# Patient Record
Sex: Male | Born: 1980 | Race: White | Hispanic: No | Marital: Married | Smoking: Never smoker
Health system: Southern US, Community
[De-identification: ages and names within clinical notes are randomized; demographics above are authoritative.]

## PROBLEM LIST (undated history)

## (undated) DIAGNOSIS — Z978 Presence of other specified devices: Secondary | ICD-10-CM

## (undated) DIAGNOSIS — J0510 Acute epiglottitis without obstruction: Secondary | ICD-10-CM

## (undated) HISTORY — DX: Acute epiglottitis without obstruction: J05.10

## (undated) HISTORY — DX: Presence of other specified devices: Z97.8

---

## 2007-04-04 ENCOUNTER — Emergency Department (HOSPITAL_COMMUNITY): Admission: EM | Admit: 2007-04-04 | Discharge: 2007-04-04 | Payer: Self-pay | Admitting: Emergency Medicine

## 2018-02-19 ENCOUNTER — Ambulatory Visit (HOSPITAL_COMMUNITY)
Admission: EM | Admit: 2018-02-19 | Discharge: 2018-02-19 | Disposition: A | Discharge status: Home / Self Care | Payer: 59 | Attending: Family Medicine | Admitting: Family Medicine

## 2018-02-19 ENCOUNTER — Encounter (HOSPITAL_COMMUNITY): Payer: Self-pay | Admitting: Emergency Medicine

## 2018-02-19 DIAGNOSIS — M545 Low back pain, unspecified: Secondary | ICD-10-CM

## 2018-02-19 MED ORDER — PREDNISONE 10 MG PO TABS: 20.0000 mg | ORAL_TABLET | Freq: Every day | ORAL | 0 refills | Status: DC

## 2018-02-19 MED ORDER — CYCLOBENZAPRINE HCL 10 MG PO TABS: 10.0000 mg | ORAL_TABLET | Freq: Two times a day (BID) | ORAL | 0 refills | Status: DC | PRN

## 2018-02-19 NOTE — ED Triage Notes (Addendum)
PT reports lower back pain that started Tuesday morning after bending down repeatedly. PT took a muscle relaxer Tues night that helped, but pain returned the next morning. PT has tried heating pad, back pain patches, and rest.

## 2018-02-19 NOTE — ED Provider Notes (Signed)
[facility]    CSN: [id number] Arrival date & time: 02/19/18  1750     History   Chief Complaint Chief Complaint  Patient presents with  . Back Pain    HPI YAZID POP is a 37 y.o. male.   Patient complains of several day history of low back pain.  There is no radiation.  There is no injury.  Patient does some lifting and bending at work but does not think there was any one event because the pain.  There is no prior history of back problems.  HPI  History reviewed. No pertinent past medical history.  There are no active problems to display for this patient.   History reviewed. No pertinent surgical history.     Home Medications    Prior to Admission medications   Not on File    Family History No family history on file.  Social History Social History   Tobacco Use  . Smoking status: Not on file  Substance Use Topics  . Alcohol use: Not on file  . Drug use: Not on file     Allergies   Patient has no known allergies.   Review of Systems Review of Systems  Constitutional: Negative.   Musculoskeletal: Positive for back pain.     Physical Exam Triage Vital Signs ED Triage Vitals  Enc Vitals Group     BP 02/19/18 1809 (!) 145/84     Pulse Rate 02/19/18 1809 95     Resp 02/19/18 1809 16     Temp 02/19/18 1809 98.1 F (36.7 C)     Temp Source 02/19/18 1809 Oral     SpO2 02/19/18 1809 100 %     Weight 02/19/18 1810 216 lb (98 kg)     Height --      Head Circumference --      Peak Flow --      Pain Score 02/19/18 1810 6     Pain Loc --      Pain Edu? --      Excl. in GC? --    No data found.  Updated Vital Signs BP (!) 145/84   Pulse 95   Temp 98.1 F (36.7 C) (Oral)   Resp 16   Wt 98 kg   SpO2 100%   Visual Acuity Right Eye Distance:   Left Eye Distance:   Bilateral Distance:    Right Eye Near:   Left Eye Near:    Bilateral Near:     Physical Exam  Constitutional: He appears well-developed and well-nourished.    Cardiovascular: Normal rate and regular rhythm.  Pulmonary/Chest: Effort normal.  Musculoskeletal:  Back: There is normal range of motion but with increased pain with extension and forward flexion.  Deep tendon reflexes are symmetric.  Strength is normal in the lower extremities.  Normal toe and heel walking. Straight leg raising is equivocal bilaterally. Back is most tender over SI joints bilaterally.     UC Treatments / Results  Labs (all labs ordered are listed, but only abnormal results are displayed) Labs Reviewed - No data to display  EKG None  Radiology No results found.  Procedures Procedures (including critical care time)  Medications Ordered in UC Medications - No data to display  Initial Impression / Assessment and Plan / UC Course  I have reviewed the triage vital signs and the nursing notes.  Pertinent labs & imaging results that were available during my care of the patient were reviewed by  me and considered in my medical decision making (see chart for details).     Low back pain secondary to strain.  Will place him on light duty at work.  Combination of muscle relaxant and anti-inflammatory as well as back exercises. Final Clinical Impressions(s) / UC Diagnoses   Final diagnoses:  None   Discharge Instructions   None    ED Prescriptions    None     Controlled Substance Prescriptions [place] Controlled Substance Registry consulted? No   Frederica KusterMiller, Daxson Reffett M, MD 02/19/18 [id number]

## 2018-03-05 ENCOUNTER — Ambulatory Visit (HOSPITAL_COMMUNITY)
Admission: EM | Admit: 2018-03-05 | Discharge: 2018-03-05 | Disposition: A | Discharge status: Home / Self Care | Payer: 59 | Attending: Family Medicine | Admitting: Family Medicine

## 2018-03-05 DIAGNOSIS — M25511 Pain in right shoulder: Secondary | ICD-10-CM

## 2018-03-05 MED ORDER — MELOXICAM 7.5 MG PO TABS: 7.5000 mg | ORAL_TABLET | Freq: Every day | ORAL | 0 refills | Status: AC

## 2018-03-05 MED ORDER — IBUPROFEN 800 MG PO TABS: 800.0000 mg | ORAL_TABLET | Freq: Three times a day (TID) | ORAL | 0 refills | Status: DC

## 2018-03-05 NOTE — ED Provider Notes (Signed)
[facility]    CSN: [id number] Arrival date & time: 03/05/18  1751     History   Chief Complaint No chief complaint on file.   HPI Richard Nash is a 37 y.o. male no significant past medical history presenting today for evaluation of right shoulder pain.  Patient states that he woke up this morning with pain in his right shoulder, mainly located in his upper back and will send a "twinge" down arm with certain movements.  States that he does a lot of heavy lifting at work.  Denies previous issues with this shoulder.  Denies difficulty fully moving his shoulder, but pain increases with over the hand movements.  Denies numbness or tingling.  Denies difficulty moving neck or neck pain.  Denies fevers.  HPI  No past medical history on file.  There are no active problems to display for this patient.   No past surgical history on file.     Home Medications    Prior to Admission medications   Medication Sig Start Date End Date Taking? Authorizing Provider  cyclobenzaprine (FLEXERIL) 10 MG tablet Take 1 tablet (10 mg total) by mouth 2 (two) times daily as needed for muscle spasms. 02/19/18   Frederica Kuster, MD  ibuprofen (ADVIL,MOTRIN) 800 MG tablet Take 1 tablet (800 mg total) by mouth 3 (three) times daily. 03/05/18   Chanita Boden C, PA-C  meloxicam (MOBIC) 7.5 MG tablet Take 1 tablet (7.5 mg total) by mouth daily for 14 days. Take in the morning, with food. 03/05/18 03/19/18  Giah Fickett, Junius Creamer, PA-C    Family History No family history on file.  Social History Social History   Tobacco Use  . Smoking status: Not on file  Substance Use Topics  . Alcohol use: Not on file  . Drug use: Not on file     Allergies   Patient has no known allergies.   Review of Systems Review of Systems  Constitutional: Negative for fatigue and fever.  Eyes: Negative for redness, itching and visual disturbance.  Respiratory: Negative for shortness of breath.     Cardiovascular: Negative for chest pain and leg swelling.  Gastrointestinal: Negative for nausea and vomiting.  Musculoskeletal: Positive for arthralgias and myalgias. Negative for back pain, neck pain and neck stiffness.  Skin: Negative for color change, rash and wound.  Neurological: Negative for dizziness, syncope, weakness, light-headedness and headaches.     Physical Exam Triage Vital Signs ED Triage Vitals [03/05/18 1837]  Enc Vitals Group     BP 139/89     Pulse Rate 87     Resp 16     Temp 98.1 F (36.7 C)     Temp Source Oral     SpO2 100 %     Weight      Height      Head Circumference      Peak Flow      Pain Score      Pain Loc      Pain Edu?      Excl. in GC?    No data found.  Updated Vital Signs BP 139/89 (BP Location: Left Arm)   Pulse 87   Temp 98.1 F (36.7 C) (Oral)   Resp 16   SpO2 100%   Visual Acuity Right Eye Distance:   Left Eye Distance:   Bilateral Distance:    Right Eye Near:   Left Eye Near:    Bilateral Near:     Physical  Exam  Constitutional: He is oriented to person, place, and time. He appears well-developed and well-nourished.  No acute distress  HENT:  Head: Normocephalic and atraumatic.  Nose: Nose normal.  Eyes: Conjunctivae are normal.  Neck: Neck supple.  Cardiovascular: Normal rate.  Pulmonary/Chest: Effort normal. No respiratory distress.  Abdominal: He exhibits no distension.  Musculoskeletal: Normal range of motion.  Nontender to palpation throughout clavicle, tenderness to palpation over distal scapular spine near [facility] joint, full active range of motion in all directions, strength 5/5 and equal bilaterally at shoulders, negative liftoff, empty can, painful arc, resisted external rotation, Hawkins.  Neurological: He is alert and oriented to person, place, and time.  Skin: Skin is warm and dry.  Psychiatric: He has a normal mood and affect.  Nursing note and vitals reviewed.    UC Treatments / Results   Labs (all labs ordered are listed, but only abnormal results are displayed) Labs Reviewed - No data to display  EKG None  Radiology No results found.  Procedures Procedures (including critical care time)  Medications Ordered in UC Medications - No data to display  Initial Impression / Assessment and Plan / UC Course  I have reviewed the triage vital signs and the nursing notes.  Pertinent labs & imaging results that were available during my care of the patient were reviewed by me and considered in my medical decision making (see chart for details).     Patient with right shoulder pain x1 day without injury, will defer imaging, will recommend conservative treatment with anti-inflammatories, ice, activity modification.  Do not suspect rotator cuff tear at this time given full range of motion and negative special tests.  Will have patient follow-up with shoulder pain not improving as expected with above treatment over the next 1 to 2 weeks.Discussed strict return precautions. Patient verbalized understanding and is agreeable with plan.  Note- Provided Mobic to use daily, also provided ibuprofen, but advised to not use these 2 together, use one or the other depending on whichever provides him more relief.  Final Clinical Impressions(s) / UC Diagnoses   Final diagnoses:  Acute pain of right shoulder     Discharge Instructions     Use anti-inflammatories for pain/swelling. You may take up to 800 mg Ibuprofen every 8 hours OR Mobic daily with food. You may supplement Ibuprofen with Tylenol [phone] mg every 8 hours.   Alternate Ice and heat  No heavy lifting for 1 week  Follow up if shoulder pain persisting, not improving, worsening, developing stiffness, decreased range of motion    ED Prescriptions    Medication Sig Dispense Auth. Provider   meloxicam (MOBIC) 7.5 MG tablet Take 1 tablet (7.5 mg total) by mouth daily for 14 days. Take in the morning, with food. 14 tablet  Nirvana Blanchett C, PA-C   ibuprofen (ADVIL,MOTRIN) 800 MG tablet Take 1 tablet (800 mg total) by mouth 3 (three) times daily. 21 tablet Krystle Oberman, LiverpoolHallie C, PA-C     Controlled Substance Prescriptions [place] Controlled Substance Registry consulted? Not Applicable   Foy GuadalajaraWieters, Marianne Golightly C, PA-C 03/05/18 1905

## 2018-03-05 NOTE — Discharge Instructions (Addendum)
Use anti-inflammatories for pain/swelling. You may take up to 800 mg Ibuprofen every 8 hours OR Mobic daily with food. You may supplement Ibuprofen with Tylenol [phone] mg every 8 hours.   Alternate Ice and heat  No heavy lifting for 1 week  Follow up if shoulder pain persisting, not improving, worsening, developing stiffness, decreased range of motion

## 2018-12-09 ENCOUNTER — Other Ambulatory Visit: Payer: 59

## 2018-12-09 ENCOUNTER — Other Ambulatory Visit: Payer: Self-pay

## 2018-12-09 DIAGNOSIS — Z20822 Contact with and (suspected) exposure to covid-19: Secondary | ICD-10-CM

## 2018-12-10 LAB — NOVEL CORONAVIRUS, NAA: SARS-CoV-2, NAA: NOT DETECTED

## 2019-08-11 DIAGNOSIS — Z0189 Encounter for other specified special examinations: Secondary | ICD-10-CM | POA: Diagnosis not present

## 2019-08-11 DIAGNOSIS — F1210 Cannabis abuse, uncomplicated: Secondary | ICD-10-CM | POA: Diagnosis not present

## 2019-08-11 DIAGNOSIS — M1A0720 Idiopathic chronic gout, left ankle and foot, without tophus (tophi): Secondary | ICD-10-CM | POA: Diagnosis not present

## 2019-08-27 ENCOUNTER — Ambulatory Visit: Payer: 59 | Attending: Internal Medicine

## 2019-08-30 DIAGNOSIS — Z23 Encounter for immunization: Secondary | ICD-10-CM | POA: Diagnosis not present

## 2019-09-28 DIAGNOSIS — Z23 Encounter for immunization: Secondary | ICD-10-CM | POA: Diagnosis not present

## 2020-09-22 ENCOUNTER — Emergency Department (HOSPITAL_COMMUNITY): Payer: No Typology Code available for payment source | Admitting: Anesthesiology

## 2020-09-22 ENCOUNTER — Encounter (HOSPITAL_COMMUNITY)
Admission: EM | Disposition: A | Discharge status: Home / Self Care | Payer: Self-pay | Source: Home / Self Care | Attending: Emergency Medicine

## 2020-09-22 ENCOUNTER — Ambulatory Visit (HOSPITAL_COMMUNITY)
Admission: EM | Admit: 2020-09-22 | Discharge: 2020-09-23 | Disposition: A | Discharge status: Home / Self Care | Payer: No Typology Code available for payment source | Attending: Orthopedic Surgery | Admitting: Orthopedic Surgery

## 2020-09-22 ENCOUNTER — Emergency Department (HOSPITAL_COMMUNITY): Payer: No Typology Code available for payment source

## 2020-09-22 ENCOUNTER — Other Ambulatory Visit: Payer: Self-pay

## 2020-09-22 ENCOUNTER — Encounter (HOSPITAL_COMMUNITY): Payer: Self-pay

## 2020-09-22 DIAGNOSIS — F1290 Cannabis use, unspecified, uncomplicated: Secondary | ICD-10-CM | POA: Diagnosis not present

## 2020-09-22 DIAGNOSIS — M7989 Other specified soft tissue disorders: Secondary | ICD-10-CM | POA: Diagnosis not present

## 2020-09-22 DIAGNOSIS — R0902 Hypoxemia: Secondary | ICD-10-CM | POA: Diagnosis not present

## 2020-09-22 DIAGNOSIS — Z23 Encounter for immunization: Secondary | ICD-10-CM | POA: Diagnosis not present

## 2020-09-22 DIAGNOSIS — S6990XA Unspecified injury of unspecified wrist, hand and finger(s), initial encounter: Secondary | ICD-10-CM | POA: Diagnosis not present

## 2020-09-22 DIAGNOSIS — S61402A Unspecified open wound of left hand, initial encounter: Secondary | ICD-10-CM | POA: Diagnosis not present

## 2020-09-22 DIAGNOSIS — R52 Pain, unspecified: Secondary | ICD-10-CM | POA: Diagnosis not present

## 2020-09-22 DIAGNOSIS — S472XXA Crushing injury of left shoulder and upper arm, initial encounter: Secondary | ICD-10-CM | POA: Diagnosis not present

## 2020-09-22 DIAGNOSIS — W230XXA Caught, crushed, jammed, or pinched between moving objects, initial encounter: Secondary | ICD-10-CM | POA: Insufficient documentation

## 2020-09-22 DIAGNOSIS — Z20822 Contact with and (suspected) exposure to covid-19: Secondary | ICD-10-CM | POA: Insufficient documentation

## 2020-09-22 DIAGNOSIS — S5782XA Crushing injury of left forearm, initial encounter: Secondary | ICD-10-CM | POA: Diagnosis not present

## 2020-09-22 DIAGNOSIS — S59902A Unspecified injury of left elbow, initial encounter: Secondary | ICD-10-CM | POA: Diagnosis not present

## 2020-09-22 DIAGNOSIS — Y990 Civilian activity done for income or pay: Secondary | ICD-10-CM | POA: Diagnosis not present

## 2020-09-22 DIAGNOSIS — S6992XA Unspecified injury of left wrist, hand and finger(s), initial encounter: Secondary | ICD-10-CM | POA: Diagnosis not present

## 2020-09-22 DIAGNOSIS — S6722XA Crushing injury of left hand, initial encounter: Secondary | ICD-10-CM | POA: Insufficient documentation

## 2020-09-22 DIAGNOSIS — S5782XD Crushing injury of left forearm, subsequent encounter: Secondary | ICD-10-CM

## 2020-09-22 DIAGNOSIS — S59912A Unspecified injury of left forearm, initial encounter: Secondary | ICD-10-CM | POA: Diagnosis not present

## 2020-09-22 HISTORY — PX: I & D EXTREMITY: SHX5045

## 2020-09-22 LAB — CBC WITH DIFFERENTIAL/PLATELET
Abs Immature Granulocytes: 0.02 10*3/uL (ref 0.00–0.07)
Basophils Absolute: 0.1 10*3/uL (ref 0.0–0.1)
Basophils Relative: 1 %
Eosinophils Absolute: 0.3 10*3/uL (ref 0.0–0.5)
Eosinophils Relative: 3 %
HCT: 45.8 % (ref 39.0–52.0)
Hemoglobin: 15.1 g/dL (ref 13.0–17.0)
Immature Granulocytes: 0 %
Lymphocytes Relative: 32 %
Lymphs Abs: 2.9 10*3/uL (ref 0.7–4.0)
MCH: 29.7 pg (ref 26.0–34.0)
MCHC: 33 g/dL (ref 30.0–36.0)
MCV: 90 fL (ref 80.0–100.0)
Monocytes Absolute: 0.9 10*3/uL (ref 0.1–1.0)
Monocytes Relative: 10 %
Neutro Abs: 5 10*3/uL (ref 1.7–7.7)
Neutrophils Relative %: 54 %
Platelets: 333 10*3/uL (ref 150–400)
RBC: 5.09 MIL/uL (ref 4.22–5.81)
RDW: 12.6 % (ref 11.5–15.5)
WBC: 9.1 10*3/uL (ref 4.0–10.5)
nRBC: 0 % (ref 0.0–0.2)

## 2020-09-22 LAB — BASIC METABOLIC PANEL
Anion gap: 20 — ABNORMAL HIGH (ref 5–15)
BUN: 14 mg/dL (ref 6–20)
CO2: 13 mmol/L — ABNORMAL LOW (ref 22–32)
Calcium: 9 mg/dL (ref 8.9–10.3)
Chloride: 104 mmol/L (ref 98–111)
Creatinine, Ser: 1.35 mg/dL — ABNORMAL HIGH (ref 0.61–1.24)
GFR, Estimated: >60 mL/min (ref >60)
Glucose, Bld: 167 mg/dL — ABNORMAL HIGH (ref 70–99)
Potassium: 3.7 mmol/L (ref 3.5–5.1)
Sodium: 137 mmol/L (ref 135–145)

## 2020-09-22 LAB — RESP PANEL BY RT-PCR (FLU A&B, COVID) ARPGX2
Influenza A by PCR: NEGATIVE
Influenza B by PCR: NEGATIVE
SARS Coronavirus 2 by RT PCR: NEGATIVE

## 2020-09-22 LAB — RAPID URINE DRUG SCREEN, HOSP PERFORMED
Amphetamines: NOT DETECTED
Barbiturates: NOT DETECTED
Benzodiazepines: POSITIVE — AB
Cocaine: NOT DETECTED
Opiates: POSITIVE — AB
Tetrahydrocannabinol: POSITIVE — AB

## 2020-09-22 LAB — CK: Total CK: 252 U/L (ref 49–397)

## 2020-09-22 LAB — SURGICAL PCR SCREEN
MRSA, PCR: NEGATIVE
Staphylococcus aureus: NEGATIVE

## 2020-09-22 SURGERY — IRRIGATION AND DEBRIDEMENT EXTREMITY
Anesthesia: General | Site: Hand | Laterality: Left

## 2020-09-22 MED ORDER — HYDROMORPHONE HCL 1 MG/ML IJ SOLN
1.0000 mg | Freq: Once | INTRAMUSCULAR | Status: DC
  Filled 2020-09-22: qty 1

## 2020-09-22 MED ORDER — DIPHENHYDRAMINE HCL 25 MG PO CAPS: 25.0000 mg | ORAL_CAPSULE | Freq: Four times a day (QID) | ORAL | Status: DC | PRN

## 2020-09-22 MED ORDER — CHLORHEXIDINE GLUCONATE 0.12 % MT SOLN
OROMUCOSAL | Status: AC
  Administered 2020-09-22: 15 mL via OROMUCOSAL
  Filled 2020-09-22: qty 15

## 2020-09-22 MED ORDER — CEFAZOLIN SODIUM-DEXTROSE 1-4 GM/50ML-% IV SOLN
1.0000 g | Freq: Three times a day (TID) | INTRAVENOUS | Status: DC
  Administered 2020-09-23: 1 g via INTRAVENOUS
  Filled 2020-09-22 (×3): qty 50

## 2020-09-22 MED ORDER — TETANUS-DIPHTH-ACELL PERTUSSIS 5-2.5-18.5 LF-MCG/0.5 IM SUSY
0.5000 mL | PREFILLED_SYRINGE | Freq: Once | INTRAMUSCULAR | Status: AC
  Administered 2020-09-22: 0.5 mL via INTRAMUSCULAR
  Filled 2020-09-22: qty 0.5

## 2020-09-22 MED ORDER — FENTANYL CITRATE (PF) 100 MCG/2ML IJ SOLN
INTRAMUSCULAR | Status: DC | PRN
  Administered 2020-09-22 (×5): 50 ug via INTRAVENOUS

## 2020-09-22 MED ORDER — DEXAMETHASONE SODIUM PHOSPHATE 10 MG/ML IJ SOLN
INTRAMUSCULAR | Status: AC
  Filled 2020-09-22: qty 1

## 2020-09-22 MED ORDER — FENTANYL CITRATE (PF) 250 MCG/5ML IJ SOLN
INTRAMUSCULAR | Status: AC
  Filled 2020-09-22: qty 5

## 2020-09-22 MED ORDER — FENTANYL CITRATE (PF) 100 MCG/2ML IJ SOLN: 25.0000 ug | INTRAMUSCULAR | Status: DC | PRN

## 2020-09-22 MED ORDER — OXYCODONE HCL 5 MG PO TABS: 5.0000 mg | ORAL_TABLET | ORAL | Status: DC | PRN

## 2020-09-22 MED ORDER — DEXAMETHASONE SODIUM PHOSPHATE 10 MG/ML IJ SOLN
INTRAMUSCULAR | Status: DC | PRN
  Administered 2020-09-22: 8 mg via INTRAVENOUS

## 2020-09-22 MED ORDER — SODIUM CHLORIDE 0.9 % IV BOLUS
1000.0000 mL | Freq: Once | INTRAVENOUS | Status: AC
  Administered 2020-09-22: 1000 mL via INTRAVENOUS

## 2020-09-22 MED ORDER — METHOCARBAMOL 500 MG PO TABS: 500.0000 mg | ORAL_TABLET | Freq: Four times a day (QID) | ORAL | Status: DC | PRN

## 2020-09-22 MED ORDER — MIDAZOLAM HCL 5 MG/5ML IJ SOLN
INTRAMUSCULAR | Status: DC | PRN
  Administered 2020-09-22: 2 mg via INTRAVENOUS

## 2020-09-22 MED ORDER — SENNOSIDES-DOCUSATE SODIUM 8.6-50 MG PO TABS: 1.0000 | ORAL_TABLET | Freq: Every evening | ORAL | Status: DC | PRN

## 2020-09-22 MED ORDER — CEFAZOLIN SODIUM-DEXTROSE 1-4 GM/50ML-% IV SOLN
1.0000 g | Freq: Once | INTRAVENOUS | Status: AC
  Administered 2020-09-22: 1 g via INTRAVENOUS
  Filled 2020-09-22 (×2): qty 50

## 2020-09-22 MED ORDER — ONDANSETRON HCL 4 MG PO TABS: 4.0000 mg | ORAL_TABLET | Freq: Four times a day (QID) | ORAL | Status: DC | PRN

## 2020-09-22 MED ORDER — HYDROMORPHONE HCL 1 MG/ML IJ SOLN
1.0000 mg | Freq: Once | INTRAMUSCULAR | Status: AC
  Administered 2020-09-22: 1 mg via INTRAVENOUS
  Filled 2020-09-22: qty 1

## 2020-09-22 MED ORDER — OXYCODONE HCL 5 MG PO TABS
10.0000 mg | ORAL_TABLET | ORAL | Status: DC | PRN
Start: 2020-09-22 — End: 2020-09-23
  Administered 2020-09-22 – 2020-09-23 (×4): 15 mg via ORAL
  Filled 2020-09-22 (×4): qty 3

## 2020-09-22 MED ORDER — PROPOFOL 10 MG/ML IV BOLUS
INTRAVENOUS | Status: DC | PRN
  Administered 2020-09-22: 150 mg via INTRAVENOUS

## 2020-09-22 MED ORDER — 0.9 % SODIUM CHLORIDE (POUR BTL) OPTIME
TOPICAL | Status: DC | PRN
  Administered 2020-09-22 (×2): 1000 mL

## 2020-09-22 MED ORDER — ACETAMINOPHEN 500 MG PO TABS
1000.0000 mg | ORAL_TABLET | Freq: Four times a day (QID) | ORAL | Status: DC
  Administered 2020-09-22 – 2020-09-23 (×2): 1000 mg via ORAL
  Filled 2020-09-22 (×2): qty 2

## 2020-09-22 MED ORDER — DEXMEDETOMIDINE (PRECEDEX) IN NS 20 MCG/5ML (4 MCG/ML) IV SYRINGE
PREFILLED_SYRINGE | INTRAVENOUS | Status: AC
  Filled 2020-09-22: qty 5

## 2020-09-22 MED ORDER — PROPOFOL 10 MG/ML IV BOLUS
INTRAVENOUS | Status: AC
  Filled 2020-09-22: qty 20

## 2020-09-22 MED ORDER — OXYCODONE HCL 5 MG PO TABS: 10.0000 mg | ORAL_TABLET | ORAL | Status: DC | PRN

## 2020-09-22 MED ORDER — CHLORHEXIDINE GLUCONATE 0.12 % MT SOLN: 15.0000 mL | Freq: Once | OROMUCOSAL | Status: AC

## 2020-09-22 MED ORDER — ONDANSETRON HCL 4 MG/2ML IJ SOLN
INTRAMUSCULAR | Status: DC | PRN
  Administered 2020-09-22: 4 mg via INTRAVENOUS

## 2020-09-22 MED ORDER — HYDROMORPHONE HCL 1 MG/ML IJ SOLN: 0.5000 mg | INTRAMUSCULAR | Status: DC | PRN

## 2020-09-22 MED ORDER — CEFAZOLIN SODIUM-DEXTROSE 1-4 GM/50ML-% IV SOLN: 1.0000 g | INTRAVENOUS | Status: DC

## 2020-09-22 MED ORDER — ACETAMINOPHEN 500 MG PO TABS: 1000.0000 mg | ORAL_TABLET | Freq: Four times a day (QID) | ORAL | Status: DC

## 2020-09-22 MED ORDER — CEFAZOLIN SODIUM-DEXTROSE 1-4 GM/50ML-% IV SOLN
1.0000 g | INTRAVENOUS | Status: AC
  Administered 2020-09-22: 1 g via INTRAVENOUS
  Filled 2020-09-22: qty 50

## 2020-09-22 MED ORDER — KCL IN DEXTROSE-NACL 20-5-0.45 MEQ/L-%-% IV SOLN
INTRAVENOUS | Status: DC
  Filled 2020-09-22 (×3): qty 1000

## 2020-09-22 MED ORDER — SUGAMMADEX SODIUM 200 MG/2ML IV SOLN
INTRAVENOUS | Status: DC | PRN
  Administered 2020-09-22: 200 mg via INTRAVENOUS

## 2020-09-22 MED ORDER — ACETAMINOPHEN 325 MG PO TABS
325.0000 mg | ORAL_TABLET | Freq: Four times a day (QID) | ORAL | Status: DC | PRN
Start: 2020-09-23 — End: 2020-09-23

## 2020-09-22 MED ORDER — METHOCARBAMOL 1000 MG/10ML IJ SOLN: 500.0000 mg | Freq: Four times a day (QID) | INTRAVENOUS | Status: DC | PRN

## 2020-09-22 MED ORDER — CEFAZOLIN SODIUM-DEXTROSE 1-4 GM/50ML-% IV SOLN: 1.0000 g | Freq: Three times a day (TID) | INTRAVENOUS | Status: DC

## 2020-09-22 MED ORDER — HYDROMORPHONE HCL 1 MG/ML IJ SOLN
INTRAMUSCULAR | Status: AC
  Administered 2020-09-22: 1 mg via INTRAVENOUS
  Filled 2020-09-22: qty 1

## 2020-09-22 MED ORDER — CEFAZOLIN SODIUM-DEXTROSE 2-4 GM/100ML-% IV SOLN
INTRAVENOUS | Status: AC
  Filled 2020-09-22: qty 100

## 2020-09-22 MED ORDER — ONDANSETRON HCL 4 MG/2ML IJ SOLN: 4.0000 mg | Freq: Four times a day (QID) | INTRAMUSCULAR | Status: DC | PRN

## 2020-09-22 MED ORDER — MIDAZOLAM HCL 2 MG/2ML IJ SOLN
INTRAMUSCULAR | Status: AC
  Filled 2020-09-22: qty 2

## 2020-09-22 MED ORDER — MORPHINE SULFATE (PF) 4 MG/ML IV SOLN
4.0000 mg | Freq: Once | INTRAVENOUS | Status: AC
  Administered 2020-09-22: 4 mg via INTRAVENOUS
  Filled 2020-09-22: qty 1

## 2020-09-22 MED ORDER — SODIUM CHLORIDE 0.9 % IR SOLN
Status: DC | PRN
  Administered 2020-09-22: 3000 mL

## 2020-09-22 MED ORDER — ASCORBIC ACID 500 MG PO TABS
1000.0000 mg | ORAL_TABLET | Freq: Every day | ORAL | Status: DC
  Administered 2020-09-22 – 2020-09-23 (×2): 1000 mg via ORAL
  Filled 2020-09-22 (×2): qty 2

## 2020-09-22 MED ORDER — PHENYLEPHRINE 40 MCG/ML (10ML) SYRINGE FOR IV PUSH (FOR BLOOD PRESSURE SUPPORT)
PREFILLED_SYRINGE | INTRAVENOUS | Status: AC
  Filled 2020-09-22: qty 10

## 2020-09-22 MED ORDER — ONDANSETRON HCL 4 MG/2ML IJ SOLN
INTRAMUSCULAR | Status: AC
  Filled 2020-09-22: qty 2

## 2020-09-22 MED ORDER — PROMETHAZINE HCL 25 MG/ML IJ SOLN: 6.2500 mg | INTRAMUSCULAR | Status: DC | PRN

## 2020-09-22 MED ORDER — ADULT MULTIVITAMIN W/MINERALS CH
1.0000 | ORAL_TABLET | Freq: Every day | ORAL | Status: DC
  Administered 2020-09-22 – 2020-09-23 (×2): 1 via ORAL
  Filled 2020-09-22 (×2): qty 1

## 2020-09-22 MED ORDER — ASCORBIC ACID 500 MG PO TABS: 1000.0000 mg | ORAL_TABLET | Freq: Every day | ORAL | Status: DC

## 2020-09-22 MED ORDER — CEFAZOLIN SODIUM-DEXTROSE 2-4 GM/100ML-% IV SOLN
2.0000 g | INTRAVENOUS | Status: AC
  Administered 2020-09-22: 2 g via INTRAVENOUS

## 2020-09-22 MED ORDER — LACTATED RINGERS IV SOLN: INTRAVENOUS | Status: DC

## 2020-09-22 MED ORDER — CHLORHEXIDINE GLUCONATE 4 % EX LIQD: 60.0000 mL | Freq: Once | CUTANEOUS | Status: DC

## 2020-09-22 MED ORDER — POVIDONE-IODINE 10 % EX SWAB: 2.0000 "application " | Freq: Once | CUTANEOUS | Status: DC

## 2020-09-22 MED ORDER — LIDOCAINE 2% (20 MG/ML) 5 ML SYRINGE
INTRAMUSCULAR | Status: DC | PRN
  Administered 2020-09-22: 100 mg via INTRAVENOUS

## 2020-09-22 MED ORDER — METHOCARBAMOL 1000 MG/10ML IJ SOLN
500.0000 mg | Freq: Four times a day (QID) | INTRAVENOUS | Status: DC | PRN
  Filled 2020-09-22: qty 5

## 2020-09-22 MED ORDER — HYDROMORPHONE HCL 1 MG/ML IJ SOLN: 1.0000 mg | INTRAMUSCULAR | Status: DC | PRN

## 2020-09-22 MED ORDER — DEXMEDETOMIDINE (PRECEDEX) IN NS 20 MCG/5ML (4 MCG/ML) IV SYRINGE
PREFILLED_SYRINGE | INTRAVENOUS | Status: DC | PRN
  Administered 2020-09-22: 12 ug via INTRAVENOUS
  Administered 2020-09-22: 8 ug via INTRAVENOUS

## 2020-09-22 MED ORDER — ACETAMINOPHEN 325 MG PO TABS: 325.0000 mg | ORAL_TABLET | Freq: Four times a day (QID) | ORAL | Status: DC | PRN

## 2020-09-22 MED ORDER — ORAL CARE MOUTH RINSE: 15.0000 mL | Freq: Once | OROMUCOSAL | Status: AC

## 2020-09-22 MED ORDER — HYDROMORPHONE HCL 1 MG/ML IJ SOLN
1.0000 mg | Freq: Once | INTRAMUSCULAR | Status: AC
  Administered 2020-09-22: 1 mg via INTRAVENOUS

## 2020-09-22 MED ORDER — HYDROMORPHONE HCL 1 MG/ML IJ SOLN
0.5000 mg | INTRAMUSCULAR | Status: DC | PRN
  Administered 2020-09-22: 1 mg via INTRAVENOUS
  Filled 2020-09-22: qty 1

## 2020-09-22 MED ORDER — ROCURONIUM BROMIDE 10 MG/ML (PF) SYRINGE
PREFILLED_SYRINGE | INTRAVENOUS | Status: DC | PRN
  Administered 2020-09-22: 20 mg via INTRAVENOUS
  Administered 2020-09-22: 60 mg via INTRAVENOUS

## 2020-09-22 SURGICAL SUPPLY — 59 items
BNDG COHESIVE 1X5 TAN STRL LF (GAUZE/BANDAGES/DRESSINGS) IMPLANT
BNDG CONFORM 2 STRL LF (GAUZE/BANDAGES/DRESSINGS) IMPLANT
BNDG ELASTIC 3X5.8 VLCR STR LF (GAUZE/BANDAGES/DRESSINGS) ×3 IMPLANT
BNDG ELASTIC 4X5.8 VLCR STR LF (GAUZE/BANDAGES/DRESSINGS) ×3 IMPLANT
BNDG ESMARK 4X9 LF (GAUZE/BANDAGES/DRESSINGS) ×3 IMPLANT
BNDG GAUZE ELAST 4 BULKY (GAUZE/BANDAGES/DRESSINGS) ×12 IMPLANT
CORD BIPOLAR FORCEPS 12FT (ELECTRODE) ×3 IMPLANT
COVER SURGICAL LIGHT HANDLE (MISCELLANEOUS) ×3 IMPLANT
COVER WAND RF STERILE (DRAPES) IMPLANT
CUFF TOURN SGL QUICK 18X4 (TOURNIQUET CUFF) ×3 IMPLANT
CUFF TOURN SGL QUICK 24 (TOURNIQUET CUFF)
CUFF TRNQT CYL 24X4X16.5-23 (TOURNIQUET CUFF) IMPLANT
DRAIN PENROSE 1/4X12 LTX STRL (WOUND CARE) IMPLANT
DRAPE SURG 17X23 STRL (DRAPES) ×3 IMPLANT
DRSG ADAPTIC 3X8 NADH LF (GAUZE/BANDAGES/DRESSINGS) ×3 IMPLANT
ELECT REM PT RETURN 9FT ADLT (ELECTROSURGICAL)
ELECTRODE REM PT RTRN 9FT ADLT (ELECTROSURGICAL) IMPLANT
GAUZE SPONGE 4X4 12PLY STRL (GAUZE/BANDAGES/DRESSINGS) ×3 IMPLANT
GAUZE SPONGE 4X4 12PLY STRL LF (GAUZE/BANDAGES/DRESSINGS) ×3 IMPLANT
GAUZE XEROFORM 1X8 LF (GAUZE/BANDAGES/DRESSINGS) ×3 IMPLANT
GAUZE XEROFORM 5X9 LF (GAUZE/BANDAGES/DRESSINGS) ×6 IMPLANT
GLOVE BIOGEL PI IND STRL 8.5 (GLOVE) ×1 IMPLANT
GLOVE BIOGEL PI INDICATOR 8.5 (GLOVE) ×2
GLOVE SURG ORTHO 8.0 STRL STRW (GLOVE) ×3 IMPLANT
GOWN STRL REUS W/ TWL LRG LVL3 (GOWN DISPOSABLE) ×3 IMPLANT
GOWN STRL REUS W/ TWL XL LVL3 (GOWN DISPOSABLE) ×1 IMPLANT
GOWN STRL REUS W/TWL LRG LVL3 (GOWN DISPOSABLE) ×6
GOWN STRL REUS W/TWL XL LVL3 (GOWN DISPOSABLE) ×2
HANDPIECE INTERPULSE COAX TIP (DISPOSABLE)
KIT BASIN OR (CUSTOM PROCEDURE TRAY) ×3 IMPLANT
KIT TURNOVER KIT B (KITS) ×3 IMPLANT
MANIFOLD NEPTUNE II (INSTRUMENTS) ×3 IMPLANT
NEEDLE HYPO 25GX1X1/2 BEV (NEEDLE) IMPLANT
NS IRRIG 1000ML POUR BTL (IV SOLUTION) ×3 IMPLANT
PACK ORTHO EXTREMITY (CUSTOM PROCEDURE TRAY) ×3 IMPLANT
PAD ARMBOARD 7.5X6 YLW CONV (MISCELLANEOUS) ×6 IMPLANT
PAD CAST 4YDX4 CTTN HI CHSV (CAST SUPPLIES) IMPLANT
PADDING CAST COTTON 4X4 STRL (CAST SUPPLIES)
SET CYSTO W/LG BORE CLAMP LF (SET/KITS/TRAYS/PACK) IMPLANT
SET HNDPC FAN SPRY TIP SCT (DISPOSABLE) IMPLANT
SET MONITOR QUICK PRESSURE (MISCELLANEOUS) ×3 IMPLANT
SOAP 2 % CHG 4 OZ (WOUND CARE) IMPLANT
SPLINT FIBERGLASS 4X30 (CAST SUPPLIES) ×3 IMPLANT
SPONGE LAP 18X18 RF (DISPOSABLE) ×3 IMPLANT
SPONGE LAP 4X18 RFD (DISPOSABLE) ×3 IMPLANT
SUT ETHILON 4 0 PS 2 18 (SUTURE) IMPLANT
SUT ETHILON 5 0 P 3 18 (SUTURE)
SUT NYLON ETHILON 5-0 P-3 1X18 (SUTURE) IMPLANT
SUT PROLENE 3 0 PS 1 (SUTURE) ×27 IMPLANT
SWAB COLLECTION DEVICE MRSA (MISCELLANEOUS) IMPLANT
SWAB CULTURE ESWAB REG 1ML (MISCELLANEOUS) IMPLANT
SYR CONTROL 10ML LL (SYRINGE) IMPLANT
TOWEL GREEN STERILE (TOWEL DISPOSABLE) ×3 IMPLANT
TOWEL GREEN STERILE FF (TOWEL DISPOSABLE) ×3 IMPLANT
TUBE CONNECTING 12'X1/4 (SUCTIONS) ×1
TUBE CONNECTING 12X1/4 (SUCTIONS) ×2 IMPLANT
UNDERPAD 30X36 HEAVY ABSORB (UNDERPADS AND DIAPERS) ×3 IMPLANT
WATER STERILE IRR 1000ML POUR (IV SOLUTION) ×3 IMPLANT
YANKAUER SUCT BULB TIP NO VENT (SUCTIONS) ×3 IMPLANT

## 2020-09-22 NOTE — ED Provider Notes (Signed)
Care assumed from Dr. Particia Nearing after patient was transferred from [facility] emergency department for left arm injury.  Patient's arm was pulled up into a wall at work.  No underlying fractures but partial degloving of the left hand and bruising extending up to the axilla.  Compartments are swollen but still soft.  Hand surgery was consulted and requested transfer to [facility] for evaluation and surgical repair.  Physical Exam  BP 133/78   Pulse 69   Temp 97.8 F (36.6 C) (Oral)   Resp 16   Ht 5\' 9"  (1.753 m)   Wt 90.7 kg   SpO2 98%   BMI 29.53 kg/m   Physical Exam Vitals and nursing note reviewed.  Constitutional:      General: He is not in acute distress.    Appearance: Normal appearance. He is well-developed. He is not diaphoretic.  HENT:     Head: Normocephalic and atraumatic.  Eyes:     General:        Right eye: No discharge.        Left eye: No discharge.  Pulmonary:     Effort: Pulmonary effort is normal. No respiratory distress.  Musculoskeletal:     Comments: Left upper extremity in splint for transport, neurovascularly intact with normal sensation and of the fingers.  Bruising extending up to the axilla, but compartments soft  Skin:    General: Skin is warm and dry.  Neurological:     Mental Status: He is alert and oriented to person, place, and time.     Coordination: Coordination normal.  Psychiatric:        Behavior: Behavior normal.     ED Course/Procedures   Labs Reviewed  BASIC METABOLIC PANEL - Abnormal; Notable for the following components:      Result Value   CO2 13 (*)    Glucose, Bld 167 (*)    Creatinine, Ser 1.35 (*)    Anion gap 20 (*)    All other components within normal limits  RESP PANEL BY RT-PCR (FLU A&B, COVID) ARPGX2  CBC WITH DIFFERENTIAL/PLATELET  CK   DG Elbow Complete Left  Result Date: 09/22/2020 CLINICAL DATA:  Work related injury to the left arm. EXAM: LEFT ELBOW - COMPLETE 3+ VIEW COMPARISON:  None. FINDINGS: Soft tissue  swelling and gas medial to the elbow and posterior to the proximal forearm. No fracture, subluxation, or elbow joint effusion. No opaque foreign body IMPRESSION: Soft tissue injury without opaque foreign body or fracture. Electronically Signed   By: 11/22/2020 M.D.   On: 09/22/2020 07:39   DG Forearm Left  Result Date: 09/22/2020 CLINICAL DATA:  Work related injury. EXAM: LEFT FOREARM - 2 VIEW COMPARISON:  None. FINDINGS: Soft tissue gas at the hand/wrist and about the elbow. No opaque foreign body, fracture, or subluxation. IMPRESSION: Soft tissue injury without opaque foreign body or fracture. Electronically Signed   By: 11/22/2020 M.D.   On: 09/22/2020 07:38   DG Hand Complete Left  Result Date: 09/22/2020 CLINICAL DATA:  Work accident with left hand injury. EXAM: LEFT HAND - COMPLETE 3+ VIEW COMPARISON:  None. FINDINGS: Scattered soft tissue gas consistent with soft tissue injury. No opaque foreign body, fracture, or dislocation. IMPRESSION: Soft tissue injury without fracture or opaque foreign body. Electronically Signed   By: 11/22/2020 M.D.   On: 09/22/2020 07:29     Procedures  MDM   PA 11/22/2020 with hand surgery is at bedside to evaluate patient  upon arrival.  They will plan to take patient to the OR.  He will be given Ancef and I have ordered medication for pain control in the meantime before he goes up to the operating room.     Dartha Lodge, PA-C 09/22/20 1033    Blane Ohara, MD 09/23/20 [phone]

## 2020-09-22 NOTE — Anesthesia Postprocedure Evaluation (Signed)
Anesthesia Post Note  Patient: Richard Nash  Procedure(s) Performed: IRRIGATION AND DEBRIDEMENT FOREARM AND HAND WITH REPAIR (Left Hand)     Patient location during evaluation: PACU Anesthesia Type: General Level of consciousness: awake Pain management: pain level controlled Vital Signs Assessment: post-procedure vital signs reviewed and stable Respiratory status: spontaneous breathing, nonlabored ventilation, respiratory function stable and patient connected to nasal cannula oxygen Cardiovascular status: blood pressure returned to baseline and stable Postop Assessment: no apparent nausea or vomiting Anesthetic complications: no   No complications documented.  Last Vitals:  Vitals:   09/22/20 1754 09/22/20 2023  BP: (!) 131/93 (!) 139/97  Pulse: 77 92  Resp: 17 17  Temp: (!) 36.3 C 36.7 C  SpO2: 99% 99%    Last Pain:  Vitals:   09/22/20 2023  TempSrc: Oral  PainSc:                  Catheryn Bacon Mazal Ebey

## 2020-09-22 NOTE — Op Note (Signed)
PREOPERATIVE DIAGNOSIS: Left forearm crush injury with significant soft tissue injury to the palmar aspect of the left hand.  POSTOPERATIVE DIAGNOSIS: Same  ATTENDING SURGEON: Dr. Bradly Bienenstock who scrubbed and present for the entire procedure  ASSISTANT SURGEON: None  ANESTHESIA: General via endotracheal tube  OPERATIVE PROCEDURE: #1: Left forearm compartment pressure monitoring, measurements #2: Left hand irrigation and debridement significant soft tissue injury 15 cm x 3 cm wound volar aspect of the hand #3: Left hand irrigation and debridement significant soft tissue injury 16 cm x 2 cm volar wound left hand #4: Repair of traumatic laceration 15 cm left hand #5: Repair traumatic laceration 16 cm left hand  IMPLANTS: None  EBL: Minimal  RADIOGRAPHIC INTERPRETATION: None  SURGICAL INDICATIONS: Patient is a right-hand-dominant gentleman who got his left hand caught in a machine sustaining the crushing injury to his left hand and forearm.  This went all the way up to the region of his shoulder.  Patient was seen and evaluated at [facility] transfer down to [facility] for definitive treatment.  Patient was seen and evaluated and recommended undergo the above procedure.  Risks of surgery include but not limited to bleeding infection damage nearby nerves arteries or tendons loss of motion of the wrist and digits incomplete relief of symptoms and need for further surgical invention.  SURGICAL TECHNIQUE: Patient was properly identified in the preoperative holding area marked permanent marker made on the left hand and forearm indicate correct operative site.  Patient brought back operating placed supine on anesthesia table where the general anesthetic was administered.  Patient tolerated this well.  A well-padded tourniquet was placed on the left brachium and seal with the appropriate drape.  Left upper extremities then prepped and draped in normal sterile fashion.  Attention was then  turned to the left hand.  This patient did have a significant soft tissue injury to the volar aspect of the hand.  These were 2 separate lacerations. Debridement type: Excisional Debridement  Side: left  Body Location: Left hand  Tools used for debridement: scalpel and scissors  Pre-debridement Wound size (cm):   Length: 15        Width: 3     Depth: 2      Length 16    Width 2     Depth: 2   Post-debridement Wound size (cm):   Length: 15 cm       Width: 3     Depth: 2      Length 16 cm  Width 3    Depth: 3    Debridement depth beyond dead/damaged tissue down to healthy viable tissue: yes  Tissue layer involved: skin, subcutaneous tissue, muscle / fascia  Nature of tissue removed: Devitalized Tissue  Irrigation volume: 3 Liters  Irrigation fluid type: Normal Saline  The patient did have a very complex wounds over the volar aspect of the hand.  Complex wound debridement was then carried out with sharp scissors and scalpel.  Devitalized tissue was removed.  The patient had the wound that extended to the fascial layer but not through the fascia.  Debris was carried out to the fascial layer.  The tendons were all in continuity to the index long ring and small fingers.  The neurovascular bundles were in continuity.  It did not extend through the palmar fascia.  The wound was then thoroughly irrigated.  Copious wound irrigation done.  Following this the traumatic laceration was then closed carefully with simple 3-0 Prolene sutures.  The patient did have the laceration distally the laceration proximally with a skin bridge there was still soft tissue attachments to the skin bridge with fatty tissue still present so hopefully this skin bridge will heal and not need further large soft tissue flap coverage.  After closure of the wounds were then dressed with Adaptic sterile compressive bandage.  The forearm compartments were then checked for compartment pressures.  The the forearm musculature was soft  and the pressures were recorded using the [employer] compartment pressure monitor less than 30 mmHg.  The pressures volarly were within the 20s and dorsally the same.  Large Xeroform dressings were then applied over the soft tissue abrasions at the region of the elbow extending up to the shoulder.  Sterile compressive bandage then applied.  The patient is then placed in a long-arm posterior splint.  The patient is in extubated taken recovery in good condition.   POSTOPERATIVE PLAN: Patient be admitted overnight for IV antibiotics and pain control.  Watch his compartments closely.  Discharged in the morning we will see him back in the office next week for wound check we will have to watch the skin flap and whether or not the patient is going to require significant plastic surgery reconstruction if the skin flap does not survive volarly.  No radiographs the first visit.

## 2020-09-22 NOTE — Anesthesia Preprocedure Evaluation (Signed)
Anesthesia Evaluation  Patient identified by MRN, date of birth, ID band Patient awake    Reviewed: Allergy & Precautions, NPO status , Patient's Chart, lab work & pertinent test results  Airway Mallampati: II  TM Distance: >3 FB Neck ROM: Full    Dental  (+) Teeth Intact, Dental Advisory Given   Pulmonary neg pulmonary ROS,    Pulmonary exam normal breath sounds clear to auscultation       Cardiovascular negative cardio ROS Normal cardiovascular exam Rhythm:Regular Rate:Normal     Neuro/Psych negative neurological ROS     GI/Hepatic negative GI ROS, (+)     substance abuse  marijuana use,   Endo/Other  negative endocrine ROS  Renal/GU negative Renal ROS     Musculoskeletal CRUSHED ARM INJURY   Abdominal   Peds  Hematology negative hematology ROS (+)   Anesthesia Other Findings Day of surgery medications reviewed with the patient.  Reproductive/Obstetrics                             Anesthesia Physical Anesthesia Plan  ASA: II  Anesthesia Plan: General   Post-op Pain Management:    Induction: Intravenous  PONV Risk Score and Plan: 2 and Midazolam, Dexamethasone and Ondansetron  Airway Management Planned: Oral ETT  Additional Equipment:   Intra-op Plan:   Post-operative Plan: Extubation in OR  Informed Consent: I have reviewed the patients History and Physical, chart, labs and discussed the procedure including the risks, benefits and alternatives for the proposed anesthesia with the patient or authorized representative who has indicated his/her understanding and acceptance.     Dental advisory given  Plan Discussed with: CRNA  Anesthesia Plan Comments:         Anesthesia Quick Evaluation

## 2020-09-22 NOTE — Consult Note (Addendum)
Reason for Consult:Left hand injury Referring Physician: Abran Duke Time called: [id number] Time at bedside: 0947   Richard Nash is an 40 y.o. male.  HPI: Keishaun was working this morning when the material he was working with didn't break as usual and pulled his left arm into a machine with rollers. A coworker was able to shut off the machine and free his arm. He was brought to the ED at [facility] and hand surgery was consulted. We recommend transport to [facility] for definitive treatment. He c/o severe pain in the left hand; the FA and upper arm only mild pain. He is RHD.  History reviewed. No pertinent past medical history.  History reviewed. No pertinent surgical history.  History reviewed. No pertinent family history.  Social History:  reports that he has never smoked. He does not have any smokeless tobacco history on file. He reports previous alcohol use. He reports current drug use. Drug: Marijuana.  Allergies: No Known Allergies  Medications: I have reviewed the patient's current medications.  Results for orders placed or performed during the hospital encounter of 09/22/20 (from the past 48 hour(s))  Basic metabolic panel     Status: Abnormal   Collection Time: 09/22/20  7:00 AM  Result Value Ref Range   Sodium 137 135 - 145 mmol/L   Potassium 3.7 3.5 - 5.1 mmol/L   Chloride 104 98 - 111 mmol/L   CO2 13 (L) 22 - 32 mmol/L   Glucose, Bld 167 (H) 70 - 99 mg/dL    Comment: Glucose reference range applies only to samples taken after fasting for at least 8 hours.   BUN 14 6 - 20 mg/dL   Creatinine, Ser 2.02 (H) 0.61 - 1.24 mg/dL   Calcium 9.0 8.9 - 54.2 mg/dL   GFR, Estimated >70 >62 mL/min    Comment: (NOTE) Calculated using the CKD-EPI Creatinine Equation (2021)    Anion gap 20 (H) 5 - 15    Comment: Performed at [facility], [address], [place], [place] [address]  CBC with Differential     Status: None   Collection Time: 09/22/20  7:00 AM  Result Value Ref Range   WBC 9.1 4.0 - 10.5  K/uL   RBC 5.09 4.22 - 5.81 MIL/uL   Hemoglobin 15.1 13.0 - 17.0 g/dL   HCT 31.5 17.6 - 16.0 %   MCV 90.0 80.0 - 100.0 fL   MCH 29.7 26.0 - 34.0 pg   MCHC 33.0 30.0 - 36.0 g/dL   RDW 73.7 10.6 - 26.9 %   Platelets 333 150 - 400 K/uL   nRBC 0.0 0.0 - 0.2 %   Neutrophils Relative % 54 %   Neutro Abs 5.0 1.7 - 7.7 K/uL   Lymphocytes Relative 32 %   Lymphs Abs 2.9 0.7 - 4.0 K/uL   Monocytes Relative 10 %   Monocytes Absolute 0.9 0.1 - 1.0 K/uL   Eosinophils Relative 3 %   Eosinophils Absolute 0.3 0.0 - 0.5 K/uL   Basophils Relative 1 %   Basophils Absolute 0.1 0.0 - 0.1 K/uL   Immature Granulocytes 0 %   Abs Immature Granulocytes 0.02 0.00 - 0.07 K/uL    Comment: Performed at [facility] [address] [place], [place] [address]  CK     Status: None   Collection Time: 09/22/20  7:00 AM  Result Value Ref Range   Total CK 252 49 - 397 U/L    Comment: Performed at [facility], [address] [place], [place] [address]  Resp Panel by RT-PCR (Flu A&B, Covid) Nasopharyngeal Swab     Status: None   Collection Time: 09/22/20  8:49 AM   Specimen: Nasopharyngeal Swab; Nasopharyngeal(NP) swabs in vial transport medium  Result Value Ref Range   SARS Coronavirus 2 by RT PCR NEGATIVE NEGATIVE    Comment: (NOTE) SARS-CoV-2 target nucleic acids are NOT DETECTED.  The SARS-CoV-2 RNA is generally detectable in upper respiratory specimens during the acute phase of infection. The lowest concentration of SARS-CoV-2 viral copies this assay can detect is 138 copies/mL. A negative result does not preclude SARS-Cov-2 infection and should not be used as the sole basis for treatment or other patient management decisions. A negative result may occur with  improper specimen collection/handling, submission of specimen other than nasopharyngeal swab, presence of viral mutation(s) within the areas targeted by this assay, and inadequate number of viral copies(<138 copies/mL). A negative result must be  combined with clinical observations, patient history, and epidemiological information. The expected result is Negative.  Fact Sheet for Patients:  BloggerCourse.com  Fact Sheet for Healthcare Providers:  SeriousBroker.it  This test is no t yet approved or cleared by the [place] FDA and  has been authorized for detection and/or diagnosis of SARS-CoV-2 by FDA under an Emergency Use Authorization (EUA). This EUA will remain  in effect (meaning this test can be used) for the duration of the COVID-19 declaration under Section 564(b)(1) of the Act, 21 U.S.C.section 360bbb-3(b)(1), unless the authorization is terminated  or revoked sooner.       Influenza A by PCR NEGATIVE NEGATIVE   Influenza B by PCR NEGATIVE NEGATIVE    Comment: (NOTE) The Xpert Xpress SARS-CoV-2/FLU/RSV plus assay is intended as an aid in the diagnosis of influenza from Nasopharyngeal swab specimens and should not be used as a sole basis for treatment. Nasal washings and aspirates are unacceptable for Xpert Xpress SARS-CoV-2/FLU/RSV testing.  Fact Sheet for Patients: BloggerCourse.com  Fact Sheet for Healthcare Providers: SeriousBroker.it  This test is not yet approved or cleared by the [place] FDA and has been authorized for detection and/or diagnosis of SARS-CoV-2 by FDA under an Emergency Use Authorization (EUA). This EUA will remain in effect (meaning this test can be used) for the duration of the COVID-19 declaration under Section 564(b)(1) of the Act, 21 U.S.C. section 360bbb-3(b)(1), unless the authorization is terminated or revoked.  Performed at [facility], [address] [place], [place] [address]     DG Elbow Complete Left  Result Date: 09/22/2020 CLINICAL DATA:  Work related injury to the left arm. EXAM: LEFT ELBOW - COMPLETE 3+ VIEW COMPARISON:  None. FINDINGS: Soft tissue swelling  and gas medial to the elbow and posterior to the proximal forearm. No fracture, subluxation, or elbow joint effusion. No opaque foreign body IMPRESSION: Soft tissue injury without opaque foreign body or fracture. Electronically Signed   By: Marnee Spring M.D.   On: 09/22/2020 07:39   DG Forearm Left  Result Date: 09/22/2020 CLINICAL DATA:  Work related injury. EXAM: LEFT FOREARM - 2 VIEW COMPARISON:  None. FINDINGS: Soft tissue gas at the hand/wrist and about the elbow. No opaque foreign body, fracture, or subluxation. IMPRESSION: Soft tissue injury without opaque foreign body or fracture. Electronically Signed   By: Marnee Spring M.D.   On: 09/22/2020 07:38   DG Hand Complete Left  Result Date: 09/22/2020 CLINICAL DATA:  Work accident with left hand injury. EXAM: LEFT HAND - COMPLETE 3+ VIEW COMPARISON:  None. FINDINGS: Scattered soft tissue gas consistent with soft tissue injury. No opaque foreign body, fracture, or dislocation. IMPRESSION: Soft tissue injury without fracture or opaque foreign body. Electronically Signed   By: Marnee Spring M.D.   On: 09/22/2020 07:29    Review of Systems  HENT: Negative for ear discharge, ear pain, hearing loss and tinnitus.   Eyes: Negative for photophobia and pain.  Respiratory: Negative for cough and shortness of breath.   Cardiovascular: Negative for chest pain.  Gastrointestinal: Negative for abdominal pain, nausea and vomiting.  Genitourinary: Negative for dysuria, flank pain, frequency and urgency.  Musculoskeletal: Positive for arthralgias (Left hand). Negative for back pain, myalgias and neck pain.  Neurological: Negative for dizziness and headaches.  Hematological: Does not bruise/bleed easily.  Psychiatric/Behavioral: The patient is not nervous/anxious.    Blood pressure 133/78, pulse 69, temperature 97.8 F (36.6 C), temperature source Oral, resp. rate 16, height 5\' 9"  (1.753 m), weight 90.7 kg, SpO2 98 %. Physical Exam Constitutional:       General: He is not in acute distress.    Appearance: He is well-developed. He is not diaphoretic.  HENT:     Head: Normocephalic and atraumatic.  Eyes:     General: No scleral icterus.       Right eye: No discharge.        Left eye: No discharge.     Conjunctiva/sclera: Conjunctivae normal.  Cardiovascular:     Rate and Rhythm: Normal rate and regular rhythm.  Pulmonary:     Effort: Pulmonary effort is normal. No respiratory distress.  Musculoskeletal:     Cervical back: Normal range of motion.     Comments: Left shoulder, elbow, wrist, digits- Abrasions to axilla and volar FA, large degloving laceration from base of index to base of thumb and along proximal palm, severe TTP, no instability, no blocks to motion  Sens  Ax/R/M/U intact  Mot   Ax/ R/ PIN/ M/ AIN/ U grossly intact, thumb and index flexion 2/5  Rad 2+  Skin:    General: Skin is warm and dry.  Neurological:     Mental Status: He is alert.  Psychiatric:        Behavior: Behavior normal.     Assessment/Plan: Left hand injury -- Plan I&D, repair as necessary by Dr. today. Anticipate discharge after surgery.  The patient does have the significant soft tissue injury to the volar aspect of the hand.  My concern is for the salvage of the skin over the almost entire palm of the hand.  The plan today will be for irrigation and debridement and closure loosely and see whether or not the skin maintains its viability if not.  The patient is going to require extensive flap to cover the palmar aspect of the hand and may need further consultation with the reconstructive plastic surgery for potential flap reconstruction.  The patient's arm and forearm are moderately swollen and the patient has a lot of soft tissue edema but his compartments are soft.  Do not recommend at the current time given his pain level and his clinical examination forearm fasciotomy but the patient will be kept overnight for observation and close follow up  and care.  The patient voiced the reason the rationale for the urgent intervention.  The patient is able to gently wiggle his fingers he does not have full composite flexion but FDP and FDS appear to be in continuity to the index long ring and small as well as  the FPL to the thumb.  Signed informed consent was obtained today to proceed with the urgent intervention.  Images were documented in the chart of the hand injury.  His radiographs were reviewed.  Virgen Belland Melvyn NovasTMANN MD   Freeman CaldronMichael J. Jeffery, PA-C Orthopedic Surgery [phone] 09/22/2020, 10:04 AM

## 2020-09-22 NOTE — ED Notes (Addendum)
Left hand wrapped in gauze and coban at this time. Bleeding controlled with gauze. Dr. Judd Lien assessed hand prior to wrapping.  +PMS. +2 radial pulse

## 2020-09-22 NOTE — ED Triage Notes (Signed)
pt from home. Got his arm stuck in a machine at work 30 min pta. Says that it has ripped his skin and broke his fingers. Wrapped in clothing to control bleeding at this time.

## 2020-09-22 NOTE — ED Notes (Signed)
Ancef not  Given at this time. Pharmacy has to bring medication down to ED and transportation for patient to be taken to [facility] is almost here.

## 2020-09-22 NOTE — Anesthesia Procedure Notes (Signed)
Procedure Name: Intubation Date/Time: 09/22/2020 2:46 PM Performed by: Inda Coke, CRNA Pre-anesthesia Checklist: Patient identified, Emergency Drugs available, Suction available and Patient being monitored Patient Re-evaluated:Patient Re-evaluated prior to induction Oxygen Delivery Method: Circle System Utilized Preoxygenation: Pre-oxygenation with 100% oxygen Induction Type: IV induction Ventilation: Mask ventilation without difficulty Laryngoscope Size: Mac and 4 Grade View: Grade I Tube type: Oral Tube size: 7.5 mm Number of attempts: 1 Airway Equipment and Method: Stylet and Oral airway Placement Confirmation: ETT inserted through vocal cords under direct vision,  positive ETCO2 and breath sounds checked- equal and bilateral Secured at: 22 cm Tube secured with: Tape Dental Injury: Teeth and Oropharynx as per pre-operative assessment

## 2020-09-22 NOTE — ED Notes (Signed)
pts wife at bedside 

## 2020-09-22 NOTE — Progress Notes (Signed)
Pt arrived to unit via stretcher from PACU, alert/oriented in no acute distress. Surgical dressing CDI, Pt orientated/situated to room/equipments. Welcome guide/menu provided to pt/spouse with instructions. Pt verbalized understanding of instructions. Hospital valuables policy has been discussed. No complaints voiced. Hospital bed in lowest position with 3 side rails up, call bell/room phone within reach. And all wheels locked.

## 2020-09-22 NOTE — ED Notes (Signed)
VERBALLY AWKNOWLEDGED MSE WAIVER.

## 2020-09-22 NOTE — Transfer of Care (Signed)
Immediate Anesthesia Transfer of Care Note  Patient: Richard Nash  Procedure(s) Performed: IRRIGATION AND DEBRIDEMENT FOREARM AND HAND WITH REPAIR (Left Hand)  Patient Location: PACU  Anesthesia Type:General  Level of Consciousness: awake, alert  and oriented  Airway & Oxygen Therapy: Patient Spontanous Breathing and Patient connected to nasal cannula oxygen  Post-op Assessment: Report given to RN and Post -op Vital signs reviewed and stable  Post vital signs: Reviewed and stable  Last Vitals:  Vitals Value Taken Time  BP    Temp    Pulse 83 09/22/20 1614  Resp 17 09/22/20 1614  SpO2 98 % 09/22/20 1614  Vitals shown include unvalidated device data.  Last Pain:  Vitals:   09/22/20 1350  TempSrc: Oral  PainSc:          Complications: No complications documented.

## 2020-09-22 NOTE — ED Provider Notes (Signed)
[facility] EMERGENCY DEPARTMENT Provider Note   CSN: [id number] Arrival date & time: 09/22/20  9675     History Chief Complaint  Patient presents with  . Arm Injury    Richard Nash is a 40 y.o. male.  Pt presents to the ED today with left hand and arm pain.  Pt was at work this morning and his left arm was pulled into a roller up to his left bicep.  Pt said the skin has been ripped off his hand and his hand feels broken.  Pt denies any other injury.        History reviewed. No pertinent past medical history.  There are no problems to display for this patient.   History reviewed. No pertinent surgical history.     History reviewed. No pertinent family history.  Social History   Tobacco Use  . Smoking status: Never Smoker  Substance Use Topics  . Alcohol use: Not Currently  . Drug use: Yes    Types: Marijuana    Home Medications Prior to Admission medications   Medication Sig Start Date End Date Taking? Authorizing Provider  cyclobenzaprine (FLEXERIL) 10 MG tablet Take 1 tablet (10 mg total) by mouth 2 (two) times daily as needed for muscle spasms. 02/19/18   Frederica Kuster, MD  ibuprofen (ADVIL,MOTRIN) 800 MG tablet Take 1 tablet (800 mg total) by mouth 3 (three) times daily. 03/05/18   Wieters, Hallie C, PA-C    Allergies    Patient has no known allergies.  Review of Systems   Review of Systems  Musculoskeletal:       Left hand and arm pain  All other systems reviewed and are negative.   Physical Exam Updated Vital Signs BP 122/88   Pulse (!) 101   Temp 98.2 F (36.8 C) (Oral)   Resp 20   Ht 5\' 9"  (1.753 m)   Wt 90.7 kg   SpO2 98%   BMI 29.53 kg/m   Physical Exam Vitals and nursing note reviewed.  Constitutional:      General: He is in acute distress.  HENT:     Head: Normocephalic and atraumatic.     Right Ear: External ear normal.     Left Ear: External ear normal.     Nose: Nose normal.     Mouth/Throat:     Mouth: Mucous  membranes are moist.     Pharynx: Oropharynx is clear.  Eyes:     Extraocular Movements: Extraocular movements intact.     Conjunctiva/sclera: Conjunctivae normal.     Pupils: Pupils are equal, round, and reactive to light.  Cardiovascular:     Rate and Rhythm: Normal rate and regular rhythm.     Pulses: Normal pulses.     Heart sounds: Normal heart sounds.  Pulmonary:     Effort: Pulmonary effort is normal.     Breath sounds: Normal breath sounds.  Abdominal:     General: Abdomen is flat. Bowel sounds are normal.     Palpations: Abdomen is soft.  Musculoskeletal:     Cervical back: Normal range of motion and neck supple.     Comments: Unable to flex index finger. Significant left hand pain.  See picture. Bruising up to left axilla. Compartments are swollen, but still soft.  Skin:    Capillary Refill: Capillary refill takes less than 2 seconds.     Comments: Partial degloving to left hand    Neurological:     General: No focal deficit  present.     Mental Status: He is alert and oriented to person, place, and time.  Psychiatric:        Mood and Affect: Mood normal.         ED Results / Procedures / Treatments   Labs (all labs ordered are listed, but only abnormal results are displayed) Labs Reviewed  BASIC METABOLIC PANEL - Abnormal; Notable for the following components:      Result Value   CO2 13 (*)    Glucose, Bld 167 (*)    Creatinine, Ser 1.35 (*)    Anion gap 20 (*)    All other components within normal limits  RESP PANEL BY RT-PCR (FLU A&B, COVID) ARPGX2  CBC WITH DIFFERENTIAL/PLATELET  CK    EKG None  Radiology DG Elbow Complete Left  Result Date: 09/22/2020 CLINICAL DATA:  Work related injury to the left arm. EXAM: LEFT ELBOW - COMPLETE 3+ VIEW COMPARISON:  None. FINDINGS: Soft tissue swelling and gas medial to the elbow and posterior to the proximal forearm. No fracture, subluxation, or elbow joint effusion. No opaque foreign body IMPRESSION: Soft  tissue injury without opaque foreign body or fracture. Electronically Signed   By: Marnee Spring M.D.   On: 09/22/2020 07:39   DG Forearm Left  Result Date: 09/22/2020 CLINICAL DATA:  Work related injury. EXAM: LEFT FOREARM - 2 VIEW COMPARISON:  None. FINDINGS: Soft tissue gas at the hand/wrist and about the elbow. No opaque foreign body, fracture, or subluxation. IMPRESSION: Soft tissue injury without opaque foreign body or fracture. Electronically Signed   By: Marnee Spring M.D.   On: 09/22/2020 07:38   DG Hand Complete Left  Result Date: 09/22/2020 CLINICAL DATA:  Work accident with left hand injury. EXAM: LEFT HAND - COMPLETE 3+ VIEW COMPARISON:  None. FINDINGS: Scattered soft tissue gas consistent with soft tissue injury. No opaque foreign body, fracture, or dislocation. IMPRESSION: Soft tissue injury without fracture or opaque foreign body. Electronically Signed   By: Marnee Spring M.D.   On: 09/22/2020 07:29    Procedures Procedures   Medications Ordered in ED Medications  ceFAZolin (ANCEF) IVPB 1 g/50 mL premix (has no administration in time range)  HYDROmorphone (DILAUDID) injection 1 mg (has no administration in time range)  morphine 4 MG/ML injection 4 mg (4 mg Intravenous Given 09/22/20 0704)  Tdap (BOOSTRIX) injection 0.5 mL (0.5 mLs Intramuscular Given 09/22/20 0704)  HYDROmorphone (DILAUDID) injection 1 mg (1 mg Intravenous Given 09/22/20 0727)  HYDROmorphone (DILAUDID) injection 1 mg (1 mg Intravenous Given 09/22/20 0802)  sodium chloride 0.9 % bolus 1,000 mL (1,000 mLs Intravenous New Bag/Given 09/22/20 0804)    ED Course  I have reviewed the triage vital signs and the nursing notes.  Pertinent labs & imaging results that were available during my care of the patient were reviewed by me and considered in my medical decision making (see chart for details).    MDM Rules/Calculators/A&P                          Tetanus updated.  Pt given IVFs, IV pain medication, IV Ancef.   He does not have any fx on xrays.   Pt d/w Dr. Melvyn Novas (hand) who will see pt in the ED at [facility].  He requested splint for transport. Pt d/w Dr. Jacqulyn Bath (ED) who will accept him for transfer.  CRITICAL CARE Performed by: Jacalyn Lefevre   Total critical care time: 30 minutes  Critical care time was exclusive of separately billable procedures and treating other patients.  Critical care was necessary to treat or prevent imminent or life-threatening deterioration.  Critical care was time spent personally by me on the following activities: development of treatment plan with patient and/or surrogate as well as nursing, discussions with consultants, evaluation of patient's response to treatment, examination of patient, obtaining history from patient or surrogate, ordering and performing treatments and interventions, ordering and review of laboratory studies, ordering and review of radiographic studies, pulse oximetry and re-evaluation of patient's condition.  Final Clinical Impression(s) / ED Diagnoses Final diagnoses:  Degloving injury of left hand, initial encounter  Crush injury arm, left, initial encounter    Rx / DC Orders ED Discharge Orders    None       Jacalyn Lefevre, MD 09/22/20 [phone]

## 2020-09-22 NOTE — ED Notes (Signed)
pts wife states "what is taking so long, why is he still here". Informed family member and patient that hand surgery has been consulted and that it is a process. This nurse explained the process to her that the hand  surgeon will contact our EDP. Pts wife remains aggressive towards this nurse. She states "This is why he should be at [facility] and not at [facility]. Why is he still here?" Explained to the family that the patient drove here private vehicle and that we will transport him to [facility] as soon as the hand doctor has spoken to our EDP. pts wife remains agressive towards this nurse. This nurse explained to her there is no need to be rude to the staff, we are doing every thing we can and it is a timely process. I apologized for her frustrations. PTs wife no longer spoke to this nurse.

## 2020-09-22 NOTE — ED Notes (Signed)
Pt to ED via EMS transfer from [facility] d/t left hand/left arm injury. Pt awaiting to speak with surgical team.

## 2020-09-23 ENCOUNTER — Encounter (HOSPITAL_COMMUNITY): Payer: Self-pay | Admitting: Orthopedic Surgery

## 2020-09-23 DIAGNOSIS — S472XXA Crushing injury of left shoulder and upper arm, initial encounter: Secondary | ICD-10-CM | POA: Diagnosis not present

## 2020-09-23 DIAGNOSIS — Z20822 Contact with and (suspected) exposure to covid-19: Secondary | ICD-10-CM | POA: Diagnosis not present

## 2020-09-23 DIAGNOSIS — S61402A Unspecified open wound of left hand, initial encounter: Secondary | ICD-10-CM | POA: Diagnosis not present

## 2020-09-23 DIAGNOSIS — Z23 Encounter for immunization: Secondary | ICD-10-CM | POA: Diagnosis not present

## 2020-09-23 DIAGNOSIS — S6722XA Crushing injury of left hand, initial encounter: Secondary | ICD-10-CM | POA: Diagnosis not present

## 2020-09-23 DIAGNOSIS — W230XXA Caught, crushed, jammed, or pinched between moving objects, initial encounter: Secondary | ICD-10-CM | POA: Diagnosis not present

## 2020-09-23 DIAGNOSIS — F1290 Cannabis use, unspecified, uncomplicated: Secondary | ICD-10-CM | POA: Diagnosis not present

## 2020-09-23 DIAGNOSIS — Y990 Civilian activity done for income or pay: Secondary | ICD-10-CM | POA: Diagnosis not present

## 2020-09-23 MED ORDER — OXYCODONE-ACETAMINOPHEN 10-325 MG PO TABS: 1.0000 | ORAL_TABLET | Freq: Four times a day (QID) | ORAL | 0 refills | Status: DC | PRN

## 2020-09-23 MED ORDER — CEPHALEXIN 500 MG PO CAPS: 500.0000 mg | ORAL_CAPSULE | Freq: Four times a day (QID) | ORAL | 0 refills | Status: AC

## 2020-09-23 NOTE — Discharge Summary (Signed)
Physician Discharge Summary  Patient ID: Richard Nash MRN: [id number] DOB/AGE: 01-29-1981 40 y.o.  Admit date: 09/22/2020 Discharge date: 09/23/2020  Admission Diagnoses: CRUSHED ARM INJURY History reviewed. No pertinent past medical history.  Discharge Diagnoses:  Active Problems:   Crushing injury of left forearm   Surgeries: Procedure(s): IRRIGATION AND DEBRIDEMENT FOREARM AND HAND WITH REPAIR on 09/22/2020    Consultants: Treatment Team:  Bradly Bienenstock, MD  Discharged Condition: Improved  Hospital Course: Richard Nash is an 40 y.o. male who was admitted 09/22/2020 with a chief complaint of  Chief Complaint  Patient presents with  . Arm Injury  , and found to have a diagnosis of CRUSHED ARM INJURY.  They were brought to the operating room on 09/22/2020 and underwent Procedure(s): IRRIGATION AND DEBRIDEMENT FOREARM AND HAND WITH REPAIR.    They were given perioperative antibiotics:  Anti-infectives (From admission, onward)   Start     Dose/Rate Route Frequency Ordered Stop   09/23/20 0151  ceFAZolin (ANCEF) IVPB 1 g/50 mL premix        1 g 100 mL/hr over 30 Minutes Intravenous Every 8 hours 09/22/20 1752     09/23/20 0151  ceFAZolin (ANCEF) IVPB 1 g/50 mL premix  Status:  Discontinued        1 g 100 mL/hr over 30 Minutes Intravenous Every 8 hours 09/22/20 1752 09/22/20 1754   09/23/20 0000  cephALEXin (KEFLEX) 500 MG capsule        500 mg Oral 4 times daily 09/23/20 1140 10/03/20 2359   09/22/20 1845  ceFAZolin (ANCEF) IVPB 1 g/50 mL premix  Status:  Discontinued        1 g 100 mL/hr over 30 Minutes Intravenous NOW 09/22/20 1752 09/22/20 1754   09/22/20 1845  ceFAZolin (ANCEF) IVPB 1 g/50 mL premix        1 g 100 mL/hr over 30 Minutes Intravenous NOW 09/22/20 1752 09/22/20 1859   09/22/20 1415  ceFAZolin (ANCEF) IVPB 2g/100 mL premix        2 g 200 mL/hr over 30 Minutes Intravenous On call to O.R. 09/22/20 1400 09/22/20 1450   09/22/20 1404  ceFAZolin (ANCEF) 2-4  GM/100ML-% IVPB       Note to Pharmacy: Larose Hires, Lindsi   : cabinet override      09/22/20 1404 09/22/20 1453   09/22/20 0730  ceFAZolin (ANCEF) IVPB 1 g/50 mL premix        1 g 100 mL/hr over 30 Minutes Intravenous  Once 09/22/20 0659 09/22/20 1200    .  They were given sequential compression devices, early ambulation, and Other (comment) for DVT prophylaxis.  Recent vital signs:  Patient Vitals for the past 24 hrs:  BP Temp Temp src Pulse Resp SpO2  09/23/20 0722 137/78 98.2 F (36.8 C) Oral 89 17 100 %  09/23/20 0410 122/85 98 F (36.7 C) -- 76 16 97 %  09/22/20 2023 (!) 139/97 98.1 F (36.7 C) Oral 92 17 99 %  09/22/20 1754 (!) 131/93 (!) 97.3 F (36.3 C) Oral 77 17 99 %  09/22/20 1752 -- -- -- -- -- 99 %  09/22/20 1730 127/89 98 F (36.7 C) -- 84 19 95 %  09/22/20 1715 118/79 98 F (36.7 C) -- 73 19 98 %  09/22/20 1700 97/68 -- -- 68 19 96 %  09/22/20 1645 123/81 -- -- 68 20 99 %  09/22/20 1632 138/87 -- -- 69 17 100 %  09/22/20 1616 (!) 155/86  98 F (36.7 C) -- 72 (!) 21 100 %  09/22/20 1613 -- -- -- -- -- 99 %  09/22/20 1350 -- 98.5 F (36.9 C) Oral -- -- --  09/22/20 1345 (!) 129/95 -- -- 84 (!) 26 99 %  09/22/20 1315 (!) 134/98 -- -- 88 (!) 24 98 %  09/22/20 1245 (!) 150/97 -- -- 87 16 100 %  09/22/20 1230 (!) 141/93 -- -- 67 (!) 22 97 %  09/22/20 1215 (!) 134/97 -- -- 66 18 99 %  09/22/20 1145 (!) 132/92 -- -- 79 (!) 21 98 %  .  Recent laboratory studies: DG Elbow Complete Left  Result Date: 09/22/2020 CLINICAL DATA:  Work related injury to the left arm. EXAM: LEFT ELBOW - COMPLETE 3+ VIEW COMPARISON:  None. FINDINGS: Soft tissue swelling and gas medial to the elbow and posterior to the proximal forearm. No fracture, subluxation, or elbow joint effusion. No opaque foreign body IMPRESSION: Soft tissue injury without opaque foreign body or fracture. Electronically Signed   By: Marnee Spring M.D.   On: 09/22/2020 07:39   DG Forearm Left  Result Date:  09/22/2020 CLINICAL DATA:  Work related injury. EXAM: LEFT FOREARM - 2 VIEW COMPARISON:  None. FINDINGS: Soft tissue gas at the hand/wrist and about the elbow. No opaque foreign body, fracture, or subluxation. IMPRESSION: Soft tissue injury without opaque foreign body or fracture. Electronically Signed   By: Marnee Spring M.D.   On: 09/22/2020 07:38   DG Hand Complete Left  Result Date: 09/22/2020 CLINICAL DATA:  Work accident with left hand injury. EXAM: LEFT HAND - COMPLETE 3+ VIEW COMPARISON:  None. FINDINGS: Scattered soft tissue gas consistent with soft tissue injury. No opaque foreign body, fracture, or dislocation. IMPRESSION: Soft tissue injury without fracture or opaque foreign body. Electronically Signed   By: Marnee Spring M.D.   On: 09/22/2020 07:29    Discharge Medications:     Diagnostic Studies: DG Elbow Complete Left  Result Date: 09/22/2020 CLINICAL DATA:  Work related injury to the left arm. EXAM: LEFT ELBOW - COMPLETE 3+ VIEW COMPARISON:  None. FINDINGS: Soft tissue swelling and gas medial to the elbow and posterior to the proximal forearm. No fracture, subluxation, or elbow joint effusion. No opaque foreign body IMPRESSION: Soft tissue injury without opaque foreign body or fracture. Electronically Signed   By: Marnee Spring M.D.   On: 09/22/2020 07:39   DG Forearm Left  Result Date: 09/22/2020 CLINICAL DATA:  Work related injury. EXAM: LEFT FOREARM - 2 VIEW COMPARISON:  None. FINDINGS: Soft tissue gas at the hand/wrist and about the elbow. No opaque foreign body, fracture, or subluxation. IMPRESSION: Soft tissue injury without opaque foreign body or fracture. Electronically Signed   By: Marnee Spring M.D.   On: 09/22/2020 07:38   DG Hand Complete Left  Result Date: 09/22/2020 CLINICAL DATA:  Work accident with left hand injury. EXAM: LEFT HAND - COMPLETE 3+ VIEW COMPARISON:  None. FINDINGS: Scattered soft tissue gas consistent with soft tissue injury. No opaque foreign  body, fracture, or dislocation. IMPRESSION: Soft tissue injury without fracture or opaque foreign body. Electronically Signed   By: Marnee Spring M.D.   On: 09/22/2020 07:29    They benefited maximally from their hospital stay and there were no complications.     Disposition: Discharge disposition: 01-Home or Self Care      Discharge Instructions    Discharge patient   Complete by: As directed    Discharge disposition:  01-Home or Self Care   Discharge patient date: 09/23/2020      Follow-up Information    Bradly Bienenstock, MD In 3 days.   Specialty: Orthopedic Surgery Contact information: [address] [place] [place] [address] [phone]                Signed: Sharma Covert 09/23/2020, 11:41 AM

## 2020-09-23 NOTE — Discharge Instructions (Signed)
KEEP [facility] CALL OFFICE FOR F/U APPT [phone] on Tuesday 4/5 KEEP HAND ELEVATED ABOVE HEART OK TO APPLY ICE TO OPERATIVE AREA CONTACT OFFICE IF ANY WORSENING PAIN OR CONCERNS.

## 2020-09-23 NOTE — Progress Notes (Signed)
Discharge summary packet provided to pt with instructions. Pt verbalized understanding of instructions. No complaints. D/C to home as ordered. Spouse would be responsible for his ride. Pt remains alert/oriented in no apparent distress.

## 2020-12-20 ENCOUNTER — Encounter (HOSPITAL_COMMUNITY)
Admission: EM | Disposition: A | Discharge status: Home / Self Care | Payer: Self-pay | Source: Home / Self Care | Attending: Critical Care Medicine

## 2020-12-20 ENCOUNTER — Emergency Department (HOSPITAL_COMMUNITY): Payer: BC Managed Care – PPO | Admitting: Anesthesiology

## 2020-12-20 ENCOUNTER — Other Ambulatory Visit: Payer: Self-pay

## 2020-12-20 ENCOUNTER — Emergency Department (HOSPITAL_COMMUNITY): Payer: BC Managed Care – PPO

## 2020-12-20 ENCOUNTER — Encounter (HOSPITAL_COMMUNITY): Payer: Self-pay | Admitting: Emergency Medicine

## 2020-12-20 ENCOUNTER — Inpatient Hospital Stay (HOSPITAL_COMMUNITY): Payer: BC Managed Care – PPO

## 2020-12-20 ENCOUNTER — Inpatient Hospital Stay (HOSPITAL_COMMUNITY)
Admission: EM | Admit: 2020-12-20 | Discharge: 2020-12-25 | DRG: 152 | Disposition: A | Discharge status: Home / Self Care | Payer: BC Managed Care – PPO | Attending: Critical Care Medicine | Admitting: Critical Care Medicine

## 2020-12-20 DIAGNOSIS — R040 Epistaxis: Secondary | ICD-10-CM | POA: Diagnosis present

## 2020-12-20 DIAGNOSIS — Z888 Allergy status to other drugs, medicaments and biological substances status: Secondary | ICD-10-CM

## 2020-12-20 DIAGNOSIS — T40605A Adverse effect of unspecified narcotics, initial encounter: Secondary | ICD-10-CM | POA: Diagnosis not present

## 2020-12-20 DIAGNOSIS — Z20822 Contact with and (suspected) exposure to covid-19: Secondary | ICD-10-CM | POA: Diagnosis not present

## 2020-12-20 DIAGNOSIS — Z4682 Encounter for fitting and adjustment of non-vascular catheter: Secondary | ICD-10-CM | POA: Diagnosis not present

## 2020-12-20 DIAGNOSIS — S5782XA Crushing injury of left forearm, initial encounter: Secondary | ICD-10-CM | POA: Diagnosis not present

## 2020-12-20 DIAGNOSIS — R339 Retention of urine, unspecified: Secondary | ICD-10-CM | POA: Diagnosis not present

## 2020-12-20 DIAGNOSIS — Z978 Presence of other specified devices: Secondary | ICD-10-CM | POA: Diagnosis not present

## 2020-12-20 DIAGNOSIS — R739 Hyperglycemia, unspecified: Secondary | ICD-10-CM | POA: Diagnosis present

## 2020-12-20 DIAGNOSIS — J0511 Acute epiglottitis with obstruction: Secondary | ICD-10-CM

## 2020-12-20 DIAGNOSIS — J9601 Acute respiratory failure with hypoxia: Secondary | ICD-10-CM | POA: Diagnosis not present

## 2020-12-20 DIAGNOSIS — R1310 Dysphagia, unspecified: Secondary | ICD-10-CM | POA: Diagnosis not present

## 2020-12-20 DIAGNOSIS — J0510 Acute epiglottitis without obstruction: Secondary | ICD-10-CM | POA: Diagnosis not present

## 2020-12-20 DIAGNOSIS — Z0189 Encounter for other specified special examinations: Secondary | ICD-10-CM

## 2020-12-20 DIAGNOSIS — K5903 Drug induced constipation: Secondary | ICD-10-CM | POA: Diagnosis present

## 2020-12-20 DIAGNOSIS — R000 Tachycardia, unspecified: Secondary | ICD-10-CM | POA: Diagnosis not present

## 2020-12-20 DIAGNOSIS — K029 Dental caries, unspecified: Secondary | ICD-10-CM | POA: Diagnosis not present

## 2020-12-20 DIAGNOSIS — R590 Localized enlarged lymph nodes: Secondary | ICD-10-CM | POA: Diagnosis not present

## 2020-12-20 DIAGNOSIS — E781 Pure hyperglyceridemia: Secondary | ICD-10-CM | POA: Diagnosis present

## 2020-12-20 DIAGNOSIS — Z4659 Encounter for fitting and adjustment of other gastrointestinal appliance and device: Secondary | ICD-10-CM

## 2020-12-20 DIAGNOSIS — Z9911 Dependence on respirator [ventilator] status: Secondary | ICD-10-CM | POA: Diagnosis not present

## 2020-12-20 DIAGNOSIS — Z452 Encounter for adjustment and management of vascular access device: Secondary | ICD-10-CM | POA: Diagnosis not present

## 2020-12-20 DIAGNOSIS — J9811 Atelectasis: Secondary | ICD-10-CM | POA: Diagnosis not present

## 2020-12-20 HISTORY — PX: INTUBATION NASOTRACHEAL: SUR735

## 2020-12-20 LAB — CBC WITH DIFFERENTIAL/PLATELET
Abs Immature Granulocytes: 0.08 10*3/uL — ABNORMAL HIGH (ref 0.00–0.07)
Basophils Absolute: 0 10*3/uL (ref 0.0–0.1)
Basophils Relative: 0 %
Eosinophils Absolute: 0 10*3/uL (ref 0.0–0.5)
Eosinophils Relative: 0 %
HCT: 46.6 % (ref 39.0–52.0)
Hemoglobin: 16.3 g/dL (ref 13.0–17.0)
Immature Granulocytes: 0 %
Lymphocytes Relative: 10 %
Lymphs Abs: 1.9 10*3/uL (ref 0.7–4.0)
MCH: 29.5 pg (ref 26.0–34.0)
MCHC: 35 g/dL (ref 30.0–36.0)
MCV: 84.3 fL (ref 80.0–100.0)
Monocytes Absolute: 0.9 10*3/uL (ref 0.1–1.0)
Monocytes Relative: 5 %
Neutro Abs: 16.6 10*3/uL — ABNORMAL HIGH (ref 1.7–7.7)
Neutrophils Relative %: 85 %
Platelets: 327 10*3/uL (ref 150–400)
RBC: 5.53 MIL/uL (ref 4.22–5.81)
RDW: 12.5 % (ref 11.5–15.5)
WBC: 19.4 10*3/uL — ABNORMAL HIGH (ref 4.0–10.5)
nRBC: 0 % (ref 0.0–0.2)

## 2020-12-20 LAB — POCT I-STAT 7, (LYTES, BLD GAS, ICA,H+H)
Acid-Base Excess: 3 mmol/L — ABNORMAL HIGH (ref 0.0–2.0)
Bicarbonate: 28.1 mmol/L — ABNORMAL HIGH (ref 20.0–28.0)
Calcium, Ion: 1.19 mmol/L (ref 1.15–1.40)
HCT: 45 % (ref 39.0–52.0)
Hemoglobin: 15.3 g/dL (ref 13.0–17.0)
O2 Saturation: 99 %
Patient temperature: 98.7
Potassium: 4.2 mmol/L (ref 3.5–5.1)
Sodium: 135 mmol/L (ref 135–145)
TCO2: 29 mmol/L (ref 22–32)
pCO2 arterial: 43.2 mmHg (ref 32.0–48.0)
pH, Arterial: 7.422 (ref 7.350–7.450)
pO2, Arterial: 120 mmHg — ABNORMAL HIGH (ref 83.0–108.0)

## 2020-12-20 LAB — BASIC METABOLIC PANEL
Anion gap: 15 (ref 5–15)
BUN: 13 mg/dL (ref 6–20)
CO2: 23 mmol/L (ref 22–32)
Calcium: 9.8 mg/dL (ref 8.9–10.3)
Chloride: 98 mmol/L (ref 98–111)
Creatinine, Ser: 0.99 mg/dL (ref 0.61–1.24)
GFR, Estimated: >60 mL/min (ref >60)
Glucose, Bld: 114 mg/dL — ABNORMAL HIGH (ref 70–99)
Potassium: 3.6 mmol/L (ref 3.5–5.1)
Sodium: 136 mmol/L (ref 135–145)

## 2020-12-20 LAB — RESP PANEL BY RT-PCR (FLU A&B, COVID) ARPGX2
Influenza A by PCR: NEGATIVE
Influenza B by PCR: NEGATIVE
SARS Coronavirus 2 by RT PCR: NEGATIVE

## 2020-12-20 LAB — RESPIRATORY PANEL BY PCR

## 2020-12-20 LAB — GROUP A STREP BY PCR: Group A Strep by PCR: NOT DETECTED

## 2020-12-20 LAB — GLUCOSE, CAPILLARY
Glucose-Capillary: 159 mg/dL — ABNORMAL HIGH (ref 70–99)
Glucose-Capillary: 159 mg/dL — ABNORMAL HIGH (ref 70–99)
Glucose-Capillary: 162 mg/dL — ABNORMAL HIGH (ref 70–99)
Glucose-Capillary: 163 mg/dL — ABNORMAL HIGH (ref 70–99)

## 2020-12-20 SURGERY — INTUBATION, NASOTRACHEAL, FIBEROPTIC
Anesthesia: General

## 2020-12-20 MED ORDER — SODIUM CHLORIDE 0.9 % IV SOLN
1.0000 g | Freq: Once | INTRAVENOUS | Status: AC
  Administered 2020-12-20: 1 g via INTRAVENOUS
  Filled 2020-12-20: qty 10

## 2020-12-20 MED ORDER — DIPHENHYDRAMINE HCL 50 MG/ML IJ SOLN
50.0000 mg | Freq: Three times a day (TID) | INTRAMUSCULAR | Status: DC
  Administered 2020-12-20 – 2020-12-23 (×11): 50 mg via INTRAVENOUS
  Filled 2020-12-20 (×11): qty 1

## 2020-12-20 MED ORDER — LIDOCAINE 2% (20 MG/ML) 5 ML SYRINGE
INTRAMUSCULAR | Status: DC | PRN
  Administered 2020-12-20: 60 mg via INTRAVENOUS

## 2020-12-20 MED ORDER — PROPOFOL 500 MG/50ML IV EMUL
INTRAVENOUS | Status: DC | PRN
  Administered 2020-12-20: 75 ug/kg/min via INTRAVENOUS

## 2020-12-20 MED ORDER — DEXAMETHASONE SODIUM PHOSPHATE 10 MG/ML IJ SOLN
INTRAMUSCULAR | Status: AC
  Filled 2020-12-20: qty 1

## 2020-12-20 MED ORDER — MIDAZOLAM HCL 2 MG/2ML IJ SOLN
INTRAMUSCULAR | Status: AC
  Filled 2020-12-20: qty 2

## 2020-12-20 MED ORDER — ONDANSETRON HCL 4 MG/2ML IJ SOLN
INTRAMUSCULAR | Status: DC | PRN
  Administered 2020-12-20: 4 mg via INTRAVENOUS

## 2020-12-20 MED ORDER — OXYMETAZOLINE HCL 0.05 % NA SOLN
NASAL | Status: AC
  Filled 2020-12-20: qty 30

## 2020-12-20 MED ORDER — FENTANYL CITRATE (PF) 100 MCG/2ML IJ SOLN
50.0000 ug | INTRAMUSCULAR | Status: DC | PRN
  Filled 2020-12-20: qty 2

## 2020-12-20 MED ORDER — ROCURONIUM BROMIDE 10 MG/ML (PF) SYRINGE
PREFILLED_SYRINGE | INTRAVENOUS | Status: DC | PRN
  Administered 2020-12-20: 100 mg via INTRAVENOUS

## 2020-12-20 MED ORDER — DEXMEDETOMIDINE (PRECEDEX) IN NS 20 MCG/5ML (4 MCG/ML) IV SYRINGE
PREFILLED_SYRINGE | INTRAVENOUS | Status: AC
  Filled 2020-12-20: qty 5

## 2020-12-20 MED ORDER — ONDANSETRON HCL 4 MG/2ML IJ SOLN
4.0000 mg | Freq: Once | INTRAMUSCULAR | Status: DC
  Filled 2020-12-20: qty 2

## 2020-12-20 MED ORDER — DIPHENHYDRAMINE HCL 50 MG/ML IJ SOLN
25.0000 mg | Freq: Once | INTRAMUSCULAR | Status: AC
  Administered 2020-12-20: 25 mg via INTRAVENOUS
  Filled 2020-12-20: qty 1

## 2020-12-20 MED ORDER — ONDANSETRON HCL 4 MG/2ML IJ SOLN
INTRAMUSCULAR | Status: AC
  Filled 2020-12-20: qty 2

## 2020-12-20 MED ORDER — FENTANYL CITRATE (PF) 250 MCG/5ML IJ SOLN
INTRAMUSCULAR | Status: AC
  Filled 2020-12-20: qty 5

## 2020-12-20 MED ORDER — DEXAMETHASONE SODIUM PHOSPHATE 10 MG/ML IJ SOLN
6.0000 mg | Freq: Four times a day (QID) | INTRAMUSCULAR | Status: AC
  Administered 2020-12-20 – 2020-12-21 (×4): 6 mg via INTRAVENOUS
  Filled 2020-12-20 (×4): qty 1

## 2020-12-20 MED ORDER — DEXMEDETOMIDINE (PRECEDEX) IN NS 20 MCG/5ML (4 MCG/ML) IV SYRINGE
PREFILLED_SYRINGE | INTRAVENOUS | Status: DC | PRN
Start: 2020-12-20 — End: 2020-12-20
  Administered 2020-12-20: 20 ug via INTRAVENOUS

## 2020-12-20 MED ORDER — SUCCINYLCHOLINE CHLORIDE 200 MG/10ML IV SOSY
PREFILLED_SYRINGE | INTRAVENOUS | Status: AC
  Filled 2020-12-20: qty 10

## 2020-12-20 MED ORDER — IOHEXOL 300 MG/ML  SOLN
75.0000 mL | Freq: Once | INTRAMUSCULAR | Status: AC | PRN
  Administered 2020-12-20: 75 mL via INTRAVENOUS

## 2020-12-20 MED ORDER — MIDAZOLAM HCL 5 MG/5ML IJ SOLN
INTRAMUSCULAR | Status: DC | PRN
  Administered 2020-12-20 (×2): 1 mg via INTRAVENOUS

## 2020-12-20 MED ORDER — PROPOFOL 1000 MG/100ML IV EMUL
INTRAVENOUS | Status: AC
  Filled 2020-12-20: qty 100

## 2020-12-20 MED ORDER — CHLORHEXIDINE GLUCONATE 0.12% ORAL RINSE (MEDLINE KIT)
15.0000 mL | Freq: Two times a day (BID) | OROMUCOSAL | Status: DC
  Administered 2020-12-20 – 2020-12-24 (×8): 15 mL via OROMUCOSAL

## 2020-12-20 MED ORDER — FAMOTIDINE IN NACL 20-0.9 MG/50ML-% IV SOLN
20.0000 mg | Freq: Two times a day (BID) | INTRAVENOUS | Status: DC
  Administered 2020-12-20 – 2020-12-23 (×7): 20 mg via INTRAVENOUS
  Filled 2020-12-20 (×9): qty 50

## 2020-12-20 MED ORDER — PROPOFOL 10 MG/ML IV BOLUS
INTRAVENOUS | Status: DC | PRN
  Administered 2020-12-20: 20 mg via INTRAVENOUS
  Administered 2020-12-20: 150 mg via INTRAVENOUS
  Administered 2020-12-20: 30 mg via INTRAVENOUS

## 2020-12-20 MED ORDER — CHLORHEXIDINE GLUCONATE CLOTH 2 % EX PADS
6.0000 | MEDICATED_PAD | Freq: Every day | CUTANEOUS | Status: DC
  Administered 2020-12-20 – 2020-12-24 (×5): 6 via TOPICAL

## 2020-12-20 MED ORDER — FENTANYL BOLUS VIA INFUSION
50.0000 ug | INTRAVENOUS | Status: DC | PRN
  Administered 2020-12-22: 100 ug via INTRAVENOUS
  Administered 2020-12-23: 50 ug via INTRAVENOUS
  Filled 2020-12-20: qty 100

## 2020-12-20 MED ORDER — LACTATED RINGERS IV SOLN: INTRAVENOUS | Status: DC | PRN

## 2020-12-20 MED ORDER — ORAL CARE MOUTH RINSE
15.0000 mL | OROMUCOSAL | Status: DC
  Administered 2020-12-20 – 2020-12-23 (×23): 15 mL via OROMUCOSAL

## 2020-12-20 MED ORDER — LACTATED RINGERS IV BOLUS
1000.0000 mL | Freq: Once | INTRAVENOUS | Status: AC
  Administered 2020-12-20: 1000 mL via INTRAVENOUS

## 2020-12-20 MED ORDER — INSULIN ASPART 100 UNIT/ML IJ SOLN
0.0000 [IU] | INTRAMUSCULAR | Status: DC
  Administered 2020-12-20 (×3): 2 [IU] via SUBCUTANEOUS
  Administered 2020-12-21 (×2): 1 [IU] via SUBCUTANEOUS
  Administered 2020-12-21 (×2): 2 [IU] via SUBCUTANEOUS
  Administered 2020-12-21 – 2020-12-22 (×2): 1 [IU] via SUBCUTANEOUS
  Administered 2020-12-23: 2 [IU] via SUBCUTANEOUS
  Administered 2020-12-23: 1 [IU] via SUBCUTANEOUS

## 2020-12-20 MED ORDER — FENTANYL CITRATE (PF) 100 MCG/2ML IJ SOLN
INTRAMUSCULAR | Status: DC | PRN
  Administered 2020-12-20: 100 ug via INTRAVENOUS

## 2020-12-20 MED ORDER — DEXAMETHASONE SODIUM PHOSPHATE 10 MG/ML IJ SOLN
10.0000 mg | Freq: Once | INTRAMUSCULAR | Status: AC
  Administered 2020-12-20: 10 mg via INTRAVENOUS
  Filled 2020-12-20: qty 1

## 2020-12-20 MED ORDER — PROPOFOL 1000 MG/100ML IV EMUL
0.0000 ug/kg/min | INTRAVENOUS | Status: DC
  Administered 2020-12-20: 45 ug/kg/min via INTRAVENOUS
  Administered 2020-12-20: 50 ug/kg/min via INTRAVENOUS
  Administered 2020-12-20: 45 ug/kg/min via INTRAVENOUS
  Administered 2020-12-20: 50 ug/kg/min via INTRAVENOUS
  Administered 2020-12-21: 45 ug/kg/min via INTRAVENOUS
  Administered 2020-12-21: 50 ug/kg/min via INTRAVENOUS
  Administered 2020-12-21: 45 ug/kg/min via INTRAVENOUS
  Administered 2020-12-21 (×3): 50 ug/kg/min via INTRAVENOUS
  Administered 2020-12-21 – 2020-12-22 (×3): 45 ug/kg/min via INTRAVENOUS
  Administered 2020-12-22: 40 ug/kg/min via INTRAVENOUS
  Administered 2020-12-22: 45 ug/kg/min via INTRAVENOUS
  Administered 2020-12-22 (×2): 40 ug/kg/min via INTRAVENOUS
  Administered 2020-12-22 – 2020-12-23 (×3): 45 ug/kg/min via INTRAVENOUS
  Filled 2020-12-20 (×12): qty 100
  Filled 2020-12-20: qty 1000
  Filled 2020-12-20 (×2): qty 100
  Filled 2020-12-20: qty 200
  Filled 2020-12-20 (×3): qty 100

## 2020-12-20 MED ORDER — FENTANYL 2500MCG IN NS 250ML (10MCG/ML) PREMIX INFUSION
50.0000 ug/h | INTRAVENOUS | Status: DC
  Administered 2020-12-20: 100 ug/h via INTRAVENOUS
  Administered 2020-12-21: 150 ug/h via INTRAVENOUS
  Administered 2020-12-22 (×2): 200 ug/h via INTRAVENOUS
  Administered 2020-12-23: 175 ug/h via INTRAVENOUS
  Filled 2020-12-20 (×5): qty 250

## 2020-12-20 MED ORDER — DOCUSATE SODIUM 50 MG/5ML PO LIQD
100.0000 mg | Freq: Two times a day (BID) | ORAL | Status: DC
  Filled 2020-12-20 (×2): qty 10

## 2020-12-20 MED ORDER — POLYETHYLENE GLYCOL 3350 17 G PO PACK
17.0000 g | PACK | Freq: Every day | ORAL | Status: DC
  Filled 2020-12-20: qty 1

## 2020-12-20 MED ORDER — PROPOFOL 10 MG/ML IV BOLUS
INTRAVENOUS | Status: AC
  Filled 2020-12-20: qty 20

## 2020-12-20 MED ORDER — SUCCINYLCHOLINE CHLORIDE 200 MG/10ML IV SOSY
PREFILLED_SYRINGE | INTRAVENOUS | Status: DC | PRN
  Administered 2020-12-20: 100 mg via INTRAVENOUS

## 2020-12-20 MED ORDER — FENTANYL CITRATE (PF) 100 MCG/2ML IJ SOLN
50.0000 ug | INTRAMUSCULAR | Status: DC | PRN
  Administered 2020-12-20: 50 ug via INTRAVENOUS

## 2020-12-20 MED ORDER — OXYMETAZOLINE HCL 0.05 % NA SOLN
NASAL | Status: DC | PRN
  Administered 2020-12-20: 3 via NASAL

## 2020-12-20 SURGICAL SUPPLY — 42 items
BAG COUNTER SPONGE SURGICOUNT (BAG) IMPLANT
BAG SURGICOUNT SPONGE COUNTING (BAG)
BLADE CLIPPER SURG (BLADE) IMPLANT
BLADE SURG 15 STRL LF DISP TIS (BLADE) IMPLANT
BLADE SURG 15 STRL SS (BLADE)
CANISTER SUCT 3000ML PPV (MISCELLANEOUS) IMPLANT
CLEANER TIP ELECTROSURG 2X2 (MISCELLANEOUS) IMPLANT
COVER SURGICAL LIGHT HANDLE (MISCELLANEOUS) IMPLANT
DEFOGGER ANTIFOG KIT (MISCELLANEOUS) ×3 IMPLANT
DRAPE HALF SHEET 40X57 (DRAPES) IMPLANT
ELECT COATED BLADE 2.86 ST (ELECTRODE) IMPLANT
ELECT REM PT RETURN 9FT ADLT (ELECTROSURGICAL)
ELECTRODE REM PT RTRN 9FT ADLT (ELECTROSURGICAL) IMPLANT
GAUZE 4X4 16PLY ~~LOC~~+RFID DBL (SPONGE) IMPLANT
GAUZE XEROFORM 5X9 LF (GAUZE/BANDAGES/DRESSINGS) IMPLANT
GLOVE SURG ENC TEXT LTX SZ7 (GLOVE) IMPLANT
GLOVE SURG UNDER POLY LF SZ6 (GLOVE) IMPLANT
GOWN STRL REUS W/ TWL LRG LVL3 (GOWN DISPOSABLE) IMPLANT
GOWN STRL REUS W/TWL LRG LVL3 (GOWN DISPOSABLE)
HOLDER TRACH TUBE VELCRO 19.5 (MISCELLANEOUS) IMPLANT
KIT BASIN OR (CUSTOM PROCEDURE TRAY) IMPLANT
KIT SUCTION CATH 14FR (SUCTIONS) IMPLANT
KIT TURNOVER KIT B (KITS) ×3 IMPLANT
NEEDLE HYPO 25GX1X1/2 BEV (NEEDLE) IMPLANT
NS IRRIG 1000ML POUR BTL (IV SOLUTION) IMPLANT
PACK EENT II TURBAN DRAPE (CUSTOM PROCEDURE TRAY) IMPLANT
PACK SURGICAL SETUP 50X90 (CUSTOM PROCEDURE TRAY) IMPLANT
PAD ARMBOARD 7.5X6 YLW CONV (MISCELLANEOUS) IMPLANT
PENCIL BUTTON HOLSTER BLD 10FT (ELECTRODE) IMPLANT
PENCIL SMOKE EVACUATOR (MISCELLANEOUS) IMPLANT
SPONGE DRAIN TRACH 4X4 STRL 2S (GAUZE/BANDAGES/DRESSINGS) IMPLANT
SPONGE INTESTINAL PEANUT (DISPOSABLE) IMPLANT
SUT CHROMIC 2 0 SH (SUTURE) IMPLANT
SUT ETHILON 2 0 FS 18 (SUTURE) IMPLANT
SUT SILK 2 0 PERMA HAND 18 BK (SUTURE) IMPLANT
SUT SILK 3 0 REEL (SUTURE) IMPLANT
SYR 20ML LL LF (SYRINGE) IMPLANT
SYR BULB IRRIG 60ML STRL (SYRINGE) IMPLANT
SYR CONTROL 10ML LL (SYRINGE) IMPLANT
TUBE CONNECTING 12'X1/4 (SUCTIONS)
TUBE CONNECTING 12X1/4 (SUCTIONS) IMPLANT
WATER STERILE IRR 1000ML POUR (IV SOLUTION) IMPLANT

## 2020-12-20 NOTE — Anesthesia Preprocedure Evaluation (Addendum)
Anesthesia Evaluation  Patient identified by MRN, date of birth, ID band Patient awake    Reviewed: Allergy & Precautions, Patient's Chart, lab work & pertinent test resultsPreop documentation limited or incomplete due to emergent nature of procedure.  Airway Mallampati: II  TM Distance: >3 FB Neck ROM: Full    Dental no notable dental hx.    Pulmonary    Pulmonary exam normal breath sounds clear to auscultation       Cardiovascular Normal cardiovascular exam Rhythm:Regular Rate:Normal     Neuro/Psych    GI/Hepatic   Endo/Other    Renal/GU      Musculoskeletal   Abdominal (+) + obese,   Peds  Hematology   Anesthesia Other Findings epiglottitis  Reproductive/Obstetrics                            Anesthesia Physical Anesthesia Plan  ASA: 3 and emergent  Anesthesia Plan: General   Post-op Pain Management:    Induction: Intravenous  PONV Risk Score and Plan: 2 and Ondansetron, Dexamethasone, Midazolam and Treatment may vary due to age or medical condition  Airway Management Planned: Oral ETT, Video Laryngoscope Planned, Awake Intubation Planned, Fiberoptic Intubation Planned and Nasal ETT  Additional Equipment:   Intra-op Plan:   Post-operative Plan: Post-operative intubation/ventilation  Informed Consent:     Only emergency history available  Plan Discussed with: CRNA and Surgeon  Anesthesia Plan Comments:        Anesthesia Quick Evaluation

## 2020-12-20 NOTE — ED Notes (Signed)
Pt reports he feels like his throat is closing up, denies difficulty breathing, says he cannot swallow then confirms he can swallow saliva but it is painful. O2 sats remain above 94% on room air. EDP made aware. EDP at bedside. Orders to be placed.

## 2020-12-20 NOTE — ED Triage Notes (Signed)
Patient BIB [facility] as a tx from [facility] for Epiglottitis. Patient stated he woke up this morning around 1 a.m. felt that he had a scratchy throat, unable to swallow saliva, and felt it was hard to breath. Pt currently not hypoxic but was placed on 4L preventatively. VSS. NAD.

## 2020-12-20 NOTE — ED Provider Notes (Signed)
[facility] EMERGENCY DEPARTMENT Provider Note   CSN: [id number] Arrival date & time: 12/20/20  6160     History Chief Complaint  Patient presents with   Sore Throat    Richard Nash is a 40 y.o. male.  Patient is a 40 year old male with no significant past medical history.  He presents today for evaluation of throat pain and swelling.  Patient states he woke up this evening unable to swallow his secretions.  He describes a sore throat and feels as though he is having difficulty breathing.  He denies fevers or chills.  Denies ill contacts.  He tells me he took a negative COVID test at home yesterday.  The history is provided by the patient.  Sore Throat This is a new problem. The current episode started 3 to 5 hours ago. The problem occurs constantly. The problem has been rapidly worsening. Pertinent negatives include no chest pain and no shortness of breath. The symptoms are aggravated by swallowing. Nothing relieves the symptoms. He has tried nothing for the symptoms.      History reviewed. No pertinent past medical history.  Patient Active Problem List   Diagnosis Date Noted   Crushing injury of left forearm 09/22/2020    Past Surgical History:  Procedure Laterality Date   I & D EXTREMITY Left 09/22/2020   Procedure: IRRIGATION AND DEBRIDEMENT FOREARM AND HAND WITH REPAIR;  Surgeon: Bradly Bienenstock, MD;  Location: [facility] OR;  Service: Orthopedics;  Laterality: Left;       No family history on file.  Social History   Tobacco Use   Smoking status: Never   Smokeless tobacco: Never  Substance Use Topics   Alcohol use: Yes    Comment: occ   Drug use: Yes    Types: Marijuana    Home Medications Prior to Admission medications   Medication Sig Start Date End Date Taking? Authorizing Provider  cyclobenzaprine (FLEXERIL) 10 MG tablet Take 1 tablet (10 mg total) by mouth 2 (two) times daily as needed for muscle spasms. Patient not taking: Reported on 09/22/2020 02/19/18    Frederica Kuster, MD  ibuprofen (ADVIL,MOTRIN) 800 MG tablet Take 1 tablet (800 mg total) by mouth 3 (three) times daily. Patient not taking: Reported on 09/22/2020 03/05/18   Wieters, Junius Creamer, PA-C  oxyCODONE-acetaminophen (PERCOCET) 10-325 MG tablet Take 1 tablet by mouth every 6 (six) hours as needed for pain. 09/23/20 09/23/21  Bradly Bienenstock, MD    Allergies    Prednisone  Review of Systems   Review of Systems  Respiratory:  Negative for shortness of breath.   Cardiovascular:  Negative for chest pain.  All other systems reviewed and are negative.  Physical Exam Updated Vital Signs BP (!) 158/110   Pulse (!) 110   Temp 98.2 F (36.8 C)   Resp 18   Ht 5\' 9"  (1.753 m)   Wt 95.3 kg   SpO2 97%   BMI 31.01 kg/m   Physical Exam Vitals and nursing note reviewed.  Constitutional:      General: He is not in acute distress.    Appearance: He is well-developed. He is not diaphoretic.  HENT:     Head: Normocephalic and atraumatic.     Mouth/Throat:     Mouth: Mucous membranes are moist. No oral lesions.     Pharynx: Uvula midline. No pharyngeal swelling, oropharyngeal exudate or posterior oropharyngeal erythema.  Cardiovascular:     Rate and Rhythm: Normal rate and regular rhythm.  Heart sounds: No murmur heard.   No friction rub.  Pulmonary:     Effort: Pulmonary effort is normal. No respiratory distress.     Breath sounds: Normal breath sounds. No stridor. No wheezing or rales.  Abdominal:     General: Bowel sounds are normal. There is no distension.     Palpations: Abdomen is soft.     Tenderness: There is no abdominal tenderness.  Musculoskeletal:        General: Normal range of motion.     Cervical back: Normal range of motion and neck supple.  Skin:    General: Skin is warm and dry.  Neurological:     Mental Status: He is alert and oriented to person, place, and time.     Coordination: Coordination normal.    ED Results / Procedures / Treatments   Labs (all  labs ordered are listed, but only abnormal results are displayed) Labs Reviewed  RESP PANEL BY RT-PCR (FLU A&B, COVID) ARPGX2  BASIC METABOLIC PANEL  CBC WITH DIFFERENTIAL/PLATELET    EKG None  Radiology No results found.  Procedures Procedures   Medications Ordered in ED Medications  dexamethasone (DECADRON) injection 10 mg (has no administration in time range)    ED Course  I have reviewed the triage vital signs and the nursing notes.  Pertinent labs & imaging results that were available during my care of the patient were reviewed by me and considered in my medical decision making (see chart for details).    MDM Rules/Calculators/A&P  Patient is a 40 year old otherwise healthy male presenting with complaints of sore throat and difficulty swallowing.  He feels as though there is something swollen in his throat.  Initial strep and COVID swabs are negative.  Laboratory studies show a white count of 19,000.  CT soft tissue neck was obtained showing an inflamed and enlarged epiglottis consistent with acute epiglottitis.  IV steroids, Benadryl, and Rocephin given.  Care discussed with Dr. Elijah Birk from ENT.  In our discussion, we have decided that it would be in the patient's best interest to be sent to [facility] for further evaluation.  Patient may require intubation under a more controlled environment.    Care signed out at shift change awaiting [facility] transport.  Dr. Renaye Rakers has also evaluated the patient and is in agreement that transfer to a higher level of care for intubation is in the patient's best interest.  CRITICAL CARE Performed by: Geoffery Lyons Total critical care time: 70 minutes Critical care time was exclusive of separately billable procedures and treating other patients. Critical care was necessary to treat or prevent imminent or life-threatening deterioration. Critical care was time spent personally by me on the following activities: development of treatment  plan with patient and/or surrogate as well as nursing, discussions with consultants, evaluation of patient's response to treatment, examination of patient, obtaining history from patient or surrogate, ordering and performing treatments and interventions, ordering and review of laboratory studies, ordering and review of radiographic studies, pulse oximetry and re-evaluation of patient's condition.   Final Clinical Impression(s) / ED Diagnoses Final diagnoses:  None    Rx / DC Orders ED Discharge Orders     None        Geoffery Lyons, MD 12/21/20 [phone]

## 2020-12-20 NOTE — Anesthesia Procedure Notes (Signed)
Procedure Name: Intubation Date/Time: 12/20/2020 11:30 AM Performed by: Leonides Grills, MD Pre-anesthesia Checklist: Patient identified, Emergency Drugs available, Suction available and Patient being monitored Patient Re-evaluated:Patient Re-evaluated prior to induction Oxygen Delivery Method: Circle system utilized Preoxygenation: Pre-oxygenation with 100% oxygen Ventilation: Mask ventilation without difficulty and Nasal airway inserted- appropriate to patient size Tube type: Parker flex tip Nasal Tubes: Left and Nasal prep performed Tube size: 7.0 mm Number of attempts: 1 Airway Equipment and Method: Fiberoptic brochoscope Placement Confirmation: ETT inserted through vocal cords under direct vision, positive ETCO2 and breath sounds checked- equal and bilateral Tube secured with: Tape Dental Injury: Teeth and Oropharynx as per pre-operative assessment  Difficulty Due To: Difficulty was anticipated

## 2020-12-20 NOTE — ED Provider Notes (Signed)
8:46 AM Patient transferred from [facility] emergency department for ENT consultation.  Review of chart shows that he has epiglottitis.  He is well-appearing currently with no trouble breathing.  However he is having difficulty swallowing.  This seems to be a sudden progressive illness starting around 1 AM.  However since arriving to [facility] it has not worsened or improved.  Airway seems stable.  We will consult ENT to let them know he is here.  9:07 AM Dr. Elijah Birk will come see and scope.  10:05 AM Per Dr. Elijah Birk, will need intubation for airway protection. Given he is currently stable, he will take to OR for double set up. ICU consulted for admission.  CRITICAL CARE Performed by: Audree Camel   Total critical care time: 35 minutes  Critical care time was exclusive of separately billable procedures and treating other patients.  Critical care was necessary to treat or prevent imminent or life-threatening deterioration.  Critical care was time spent personally by me on the following activities: development of treatment plan with patient and/or surrogate as well as nursing, discussions with consultants, evaluation of patient's response to treatment, examination of patient, obtaining history from patient or surrogate, ordering and performing treatments and interventions, ordering and review of laboratory studies, ordering and review of radiographic studies, pulse oximetry and re-evaluation of patient's condition.    Pricilla Loveless, MD 12/20/20 [phone]

## 2020-12-20 NOTE — Transfer of Care (Signed)
Immediate Anesthesia Transfer of Care Note  Patient: Richard Nash  Procedure(s) Performed: ABORTED FIBEROPTIC NASAL-TRACHEAL INTUBATION  Patient Location: ICU  Anesthesia Type:General  Level of Consciousness: sedated and Patient remains intubated per anesthesia plan  Airway & Oxygen Therapy: Patient remains intubated per anesthesia plan and Patient placed on Ventilator (see vital sign flow sheet for setting)  Post-op Assessment: Report given to RN and Post -op Vital signs reviewed and stable  Post vital signs: Reviewed and stable  Last Vitals:  Vitals Value Taken Time  BP    Temp    Pulse    Resp    SpO2      Last Pain:  Vitals:   12/20/20 0850  PainSc: 9          Complications: No notable events documented.

## 2020-12-20 NOTE — Op Note (Signed)
Procedure(s): FIBEROPTIC NASAL-TRACHEAL INTUBATION  Richard Nash male 40 y.o. 12/20/2020  Procedure(s) and Anesthesia Type:    * FIBEROPTIC NASAL-TRACHEAL INTUBATION - General  Surgeon(s) and Role:    * Rejeana Brock, MD - Primary      Surgeon: Rejeana Brock   Assistants: none  Anesthesia: General endotracheal anesthesia  ASA Class: none    Procedure Detail  FIBEROPTIC NASAL-TRACHEAL INTUBATION  Findings: Severe edema involving the epiglottis noted on fiberoptic endoscopy by anesthesia.  Anesthesia MD performed fiberoptic intubation.  I was immediately available at the bedside for support should a surgical airway be required.  It was not.  We confirmed placement of the tube 3 cm above the carina endoscopically.  No desats or complications.  Estimated Blood Loss:  Minimal          Disposition: ICU - intubated and hemodynamically stable.         Condition: stable

## 2020-12-20 NOTE — ED Notes (Signed)
Patient transported to CT 

## 2020-12-20 NOTE — Progress Notes (Signed)
Order to advance ET tube 2 cm. ET tube already at 28. I advanced to 29 cm & that's as far as can be advanced & secured safely.

## 2020-12-20 NOTE — Consult Note (Addendum)
NAME:  Richard Nash, MRN:  [id number], DOB:  1980/08/10, LOS: 0 ADMISSION DATE:  12/20/2020, CONSULTATION DATE:  6/29 REFERRING MD:  ED MD, CHIEF COMPLAINT:  Epiglottitis    History of Present Illness:  Richard Nash is a 40 y.o. M with no past medical history who presented to the [facility] ED today with a sore throat and difficulty breathing, who was found to have epiglottitis.  Mr. Fickle' last po intake was on 6/28 in the morning. Around 2PM he developed a scratchy throat which did not improve after throat spray and salt water gargles. He went to bed and woke up at 1 AM on 6/29 with shortness of breath. He presented to the [facility] ED and was found to have epiglottitis on CT scan of his throat. He had one episode of emesis at [facility]. He was transferred to [facility] for ENT evaluation.  He was evaluated by ENT who have decided to take him to the OR for intubation and possible tracheostomy placement. Workup was noted for a slight leukocytosis, COVID was -, Strep  A was -. Exam was notable for dysphagia and difficulty with secretions. Denies shortness of breath and chest pain. Conversational on [facility] with no tripoding. Denies smoking history. Endorses active mariajuana use.  PCCM was consulted for assistance with mechanical ventilation and ICU management post procedure.   Pertinent  Medical History  Endorses active mariajuana use, crush injury to L hand in June 2022  Significant Hospital Events: Including procedures, antibiotic start and stop dates in addition to other pertinent events   6/29 Presented to [facility], CT demonstrated epiglottitis. Fiberoptic nasotracheal intubation in the OR.   Interim History / Subjective:  See above  Endorses dysphagia and difficulty with secretions. Denies shortness of breath and chest pain.  Objective   Blood pressure (!) 129/118, pulse (!) 105, temperature 98.2 F (36.8 C), resp. rate 17, height 5\' 9"  (1.753 m), weight 95.3 kg, SpO2 98 %. 2LNC        Intake/Output Summary  (Last 24 hours) at 12/20/2020 1025 Last data filed at 12/20/2020 1014 Gross per 24 hour  Intake --  Output 700 ml  Net -700 ml   Filed Weights   12/20/20 0343  Weight: 95.3 kg    Examination: General:  Sitting in bed, no acute distress, appears comfortablee HEENT: MM pink/moist, anicteric, atraumatic Neuro: GCS 15, RASS 0, PERRL 31mm CV: S1S2, ST, no m/r/g appreciated PULM:  clear in the upper lobes and in the lower lobes, trachea midline, chest expansion symmetric GI: soft, bsx4 active, non tender   Extremities: warm/dry, no pretibial edema, capillary refill less than 3 seconds  Skin: no rashes or lesions  Resolved Hospital Problem list     Assessment & Plan:  Acute Epiglottitis Acute Respiratory Failure secondary to Epiglottitis- s/p nasotracheal intubation in the OR with anesthesia and ENT Leukocytosis ? Infectious. S/P one dose of 1g ceftriaxone. 10mg  decadron, 25mg  benadryl -Will initiate mechanical ventilation post OR -LTVV strategy with tidal volumes of 4-8 cc/kg ideal body weight -Goal plateau pressures of 30 and driving pressures of 15 -Wean PEEP/FiO2 for SpO2 92-98 -Follow intermittent CXR and ABG PRN -PAD bundle with propofol gtt and fentanyl gtt. Goal RASS -2. Titrate to goal. Switch to precedex after 24 hours. -Will continue mechanical ventilation for at least 24 hours if not longer. Will discuss with ENT. -Initiate VAP precautions -Start Decadron 6mg  q6h for 4 doses, benadryl 50mg  q8h, pepcid 20mg  q12h. Stop benadryl once extubated -Follow up respiratory  panel. Suspect viral.   Best Practice (right click and "Reselect all SmartList Selections" daily)   Diet/type: NPO DVT prophylaxis: SCD GI prophylaxis: PPI Lines: N/A Foley:  N/A Code Status:  full code Last date of multidisciplinary goals of care discussion [Pending]  Labs   CBC: Recent Labs  Lab 12/20/20 0421  WBC 19.4*  NEUTROABS 16.6*  HGB 16.3  HCT 46.6  MCV 84.3  PLT 327    Basic  Metabolic Panel: Recent Labs  Lab 12/20/20 0421  NA 136  K 3.6  CL 98  CO2 23  GLUCOSE 114*  BUN 13  CREATININE 0.99  CALCIUM 9.8   GFR: Estimated Creatinine Clearance: 114.1 mL/min (by C-G formula based on SCr of 0.99 mg/dL). Recent Labs  Lab 12/20/20 0421  WBC 19.4*    Liver Function Tests: No results for input(s): AST, ALT, ALKPHOS, BILITOT, PROT, ALBUMIN in the last 168 hours. No results for input(s): LIPASE, AMYLASE in the last 168 hours. No results for input(s): AMMONIA in the last 168 hours.  ABG No results found for: PHART, PCO2ART, PO2ART, HCO3, TCO2, ACIDBASEDEF, O2SAT   Coagulation Profile: No results for input(s): INR, PROTIME in the last 168 hours.  Cardiac Enzymes: No results for input(s): CKTOTAL, CKMB, CKMBINDEX, TROPONINI in the last 168 hours.  HbA1C: No results found for: HGBA1C  CBG: No results for input(s): GLUCAP in the last 168 hours.  Review of Systems:   Positives in bold  Gen: fever, chills, weight change, fatigue, night sweats HEENT:  blurred vision, double vision, hearing loss, tinnitus, sinus congestion, rhinorrhea, sore throat, neck stiffness, dysphagia PULM:  difficulty breathing, shortness of breath, cough, sputum production, hemoptysis, wheezing CV: chest pain, edema, orthopnea, paroxysmal nocturnal dyspnea, palpitations GI:  abdominal pain, nausea, vomiting, diarrhea, hematochezia, melena, constipation, change in bowel habits GU: dysuria, hematuria, polyuria, oliguria, urethral discharge Endocrine: hot or cold intolerance, polyuria, polyphagia or appetite change Derm: rash, dry skin, scaling or peeling skin change Heme: easy bruising, bleeding, bleeding gums Neuro: headache, numbness, weakness, slurred speech, loss of memory or consciousness   Past Medical History:  He,  has no past medical history on file.   Surgical History:   Past Surgical History:  Procedure Laterality Date   I & D EXTREMITY Left 09/22/2020    Procedure: IRRIGATION AND DEBRIDEMENT FOREARM AND HAND WITH REPAIR;  Surgeon: Bradly Bienenstock, MD;  Location: [facility] OR;  Service: Orthopedics;  Laterality: Left;     Social History:   reports that he has never smoked. He has never used smokeless tobacco. He reports current alcohol use. He reports current drug use. Drug: Marijuana.   Family History:  His family history is not on file.   Allergies Allergies  Allergen Reactions   Prednisone Other (See Comments)    Pt not able to tolerate; had several side effects.     Home Medications  Prior to Admission medications   Medication Sig Start Date End Date Taking? Authorizing Provider  cyclobenzaprine (FLEXERIL) 10 MG tablet Take 1 tablet (10 mg total) by mouth 2 (two) times daily as needed for muscle spasms. Patient not taking: No sig reported 02/19/18   Frederica Kuster, MD  ibuprofen (ADVIL,MOTRIN) 800 MG tablet Take 1 tablet (800 mg total) by mouth 3 (three) times daily. Patient not taking: No sig reported 03/05/18   Wieters, Hallie C, PA-C  oxyCODONE-acetaminophen (PERCOCET) 10-325 MG tablet Take 1 tablet by mouth every 6 (six) hours as needed for pain. Patient not taking: Reported on 12/20/2020  09/23/20 09/23/21  Bradly Bienenstock, MD     Critical care time: n/a    Gershon Mussel., MSN, APRN, AGACNP-BC [facility]  12/20/2020 , 10:25 AM  Please see [email or web address] for pager details  If no response, please call [phone] After hours, please call Elink at [phone]

## 2020-12-20 NOTE — H&P (Signed)
Subjective:     Richard Nash is a 40 y.o. male who presents for evaluation of epiglottitis. It began this morning (6 hours ago) and is worsening.  Symptoms include progressive difficulty swallowing and a sensation of tightness in his throat.  He does not feel well. No problems breathing or speaking although difficulties with managing secretions are making communication hard at times.  He presents with his wife/girlfriend who was in the room with me and able to see the severe swelling and airway obstruction on the bedside scope.  Last meal yesterday.  No HIV disease or risk factors.  Smokes unfiltered cigarettes.  No crack or heroin.  Patient History:  The following portions of the patient's history were reviewed and updated as appropriate: allergies, current medications, past family history, past medical history, past social history, past surgical history, and problem list.  Review of Systems Pertinent items are noted in HPI.    Objective:    BP (!) 129/118   Pulse (!) 105   Temp 98.2 F (36.8 C)   Resp 17   Ht 5\' 9"  (1.753 m)   Wt 95.3 kg   SpO2 98%   BMI 31.01 kg/m   General:  alert and cooperative  Skin:  normal and no rash or abnormalities  Eyes: negative, conjunctivae/corneas clear. PERRL, EOM's intact. Fundi benign.  Mouth: MMM no lesions  Lymph Nodes:  Cervical, supraclavicular, and axillary nodes normal.  Lungs:  clear to auscultation bilaterally  Heart:  regular rate and rhythm, S1, S2 normal, no murmur, click, rub or gallop  Abdomen: deferred  CVA:  deferred  Genitourinary: defer exam  Extremities:  extremities normal, atraumatic, no cyanosis or edema  Neurologic:  Alert and oriented x3. Gait normal. Reflexes and motor strength normal and symmetric. Cranial nerves 2-12 and sensation grossly intact.  Psychiatric:  normal mood, behavior, speech, dress, and thought processes    Patient has no stridor on auscultation.  No oral cavity edema.  Neck without crepitance or  swelling - trachea midline  FFOL - NP clear/right septal deviation/BOT normal/epiglottis markedly swollen, beefy red and angry in appearance,cords visible from above  Assessment:    Epiglottitis     Plan:   Discussed options with patient and partner.  Explained risks and benefits of both observation and intubation.  Also examined that tracheostomy is sometimes needed and would be performed in an emergent life threatening situation - he voiced an understanding.  I recommend intubation now.  He agrees and understands the risks.  We will take to OR for the procedure semi-urgently.  I am walking up there now to post the case.

## 2020-12-20 NOTE — Anesthesia Postprocedure Evaluation (Signed)
Anesthesia Post Note  Patient: Richard Nash  Procedure(s) Performed: ABORTED FIBEROPTIC NASAL-TRACHEAL INTUBATION     Patient location during evaluation: ICU Anesthesia Type: General Level of consciousness: sedated Pain management: pain level controlled Vital Signs Assessment: post-procedure vital signs reviewed and stable Respiratory status: patient remains intubated per anesthesia plan Cardiovascular status: stable Postop Assessment: no apparent nausea or vomiting Anesthetic complications: no   No notable events documented.  Last Vitals:  Vitals:   12/20/20 1500 12/20/20 1519  BP: (!) 138/99   Pulse: 99   Resp: 18   Temp:  37.1 C  SpO2: 97%     Last Pain:  Vitals:   12/20/20 1519  TempSrc: Axillary  PainSc:                  Catheryn Bacon Lavinia Mcneely

## 2020-12-20 NOTE — ED Triage Notes (Signed)
Pt c/o his throat hurting when he swallows.

## 2020-12-20 NOTE — ED Notes (Signed)
Verbal order to put pt on 4 lpm of oxygen.

## 2020-12-21 ENCOUNTER — Encounter (HOSPITAL_COMMUNITY): Payer: Self-pay | Admitting: Otolaryngology

## 2020-12-21 DIAGNOSIS — J0510 Acute epiglottitis without obstruction: Secondary | ICD-10-CM | POA: Diagnosis present

## 2020-12-21 DIAGNOSIS — J0511 Acute epiglottitis with obstruction: Secondary | ICD-10-CM

## 2020-12-21 HISTORY — DX: Acute epiglottitis without obstruction: J05.10

## 2020-12-21 LAB — CBC
HCT: 42.8 % (ref 39.0–52.0)
Hemoglobin: 14.7 g/dL (ref 13.0–17.0)
MCH: 29.1 pg (ref 26.0–34.0)
MCHC: 34.3 g/dL (ref 30.0–36.0)
MCV: 84.6 fL (ref 80.0–100.0)
Platelets: 298 10*3/uL (ref 150–400)
RBC: 5.06 MIL/uL (ref 4.22–5.81)
RDW: 12.9 % (ref 11.5–15.5)
WBC: 17 10*3/uL — ABNORMAL HIGH (ref 4.0–10.5)
nRBC: 0 % (ref 0.0–0.2)

## 2020-12-21 LAB — GLUCOSE, CAPILLARY
Glucose-Capillary: 161 mg/dL — ABNORMAL HIGH (ref 70–99)
Glucose-Capillary: 159 mg/dL — ABNORMAL HIGH (ref 70–99)
Glucose-Capillary: 121 mg/dL — ABNORMAL HIGH (ref 70–99)
Glucose-Capillary: 146 mg/dL — ABNORMAL HIGH (ref 70–99)
Glucose-Capillary: 131 mg/dL — ABNORMAL HIGH (ref 70–99)

## 2020-12-21 LAB — BASIC METABOLIC PANEL
Anion gap: 11 (ref 5–15)
BUN: 17 mg/dL (ref 6–20)
CO2: 24 mmol/L (ref 22–32)
Calcium: 9.6 mg/dL (ref 8.9–10.3)
Chloride: 98 mmol/L (ref 98–111)
Creatinine, Ser: 1.05 mg/dL (ref 0.61–1.24)
GFR, Estimated: >60 mL/min (ref >60)
Glucose, Bld: 157 mg/dL — ABNORMAL HIGH (ref 70–99)
Potassium: 3.8 mmol/L (ref 3.5–5.1)
Sodium: 133 mmol/L — ABNORMAL LOW (ref 135–145)

## 2020-12-21 LAB — MAGNESIUM
Magnesium: 2.1 mg/dL (ref 1.7–2.4)
Magnesium: 2.2 mg/dL (ref 1.7–2.4)
Magnesium: 2.2 mg/dL (ref 1.7–2.4)

## 2020-12-21 LAB — TRIGLYCERIDES: Triglycerides: 258 mg/dL — ABNORMAL HIGH (ref <150)

## 2020-12-21 LAB — PHOSPHORUS
Phosphorus: 4 mg/dL (ref 2.5–4.6)
Phosphorus: 3.9 mg/dL (ref 2.5–4.6)

## 2020-12-21 LAB — HEMOGLOBIN A1C
Hgb A1c MFr Bld: 5.3 % (ref 4.8–5.6)
Mean Plasma Glucose: 105 mg/dL

## 2020-12-21 LAB — MRSA NEXT GEN BY PCR, NASAL: MRSA by PCR Next Gen: NOT DETECTED

## 2020-12-21 MED ORDER — SODIUM CHLORIDE 0.9 % IV SOLN
INTRAVENOUS | Status: DC | PRN
  Administered 2020-12-24: 250 mL via INTRAVENOUS

## 2020-12-21 MED ORDER — SODIUM CHLORIDE 0.9 % IV SOLN
2.0000 g | INTRAVENOUS | Status: DC
  Administered 2020-12-21 – 2020-12-25 (×5): 2 g via INTRAVENOUS
  Filled 2020-12-21: qty 20
  Filled 2020-12-21: qty 2
  Filled 2020-12-21 (×3): qty 20

## 2020-12-21 MED ORDER — VITAL HIGH PROTEIN PO LIQD: 1000.0000 mL | ORAL | Status: DC

## 2020-12-21 MED ORDER — ENOXAPARIN SODIUM 60 MG/0.6ML IJ SOSY
0.5000 mg/kg | PREFILLED_SYRINGE | INTRAMUSCULAR | Status: DC
  Administered 2020-12-21 – 2020-12-22 (×2): 47.5 mg via SUBCUTANEOUS
  Filled 2020-12-21 (×2): qty 0.6

## 2020-12-21 MED ORDER — PROSOURCE TF PO LIQD
45.0000 mL | Freq: Two times a day (BID) | ORAL | Status: DC
  Filled 2020-12-21: qty 45

## 2020-12-21 NOTE — Progress Notes (Signed)
Initial Nutrition Assessment  DOCUMENTATION CODES:   Not applicable  INTERVENTION:   Once able to obtain enteral access, recommend initiation of enteral nutrition: - Vital High Protein @ 55 ml/hr (1320 ml/day) - ProSource TF 45 ml BID  Recommended tube feeding regimen would provide 1400 kcal, 138 grams of protein, and 1104 ml of H2O.   Recommended tube feeding regimen and current propofol would provide 2155 total kcal (100% of needs).  NUTRITION DIAGNOSIS:   Inadequate oral intake related to inability to eat as evidenced by NPO status.  GOAL:   Patient will meet greater than or equal to 90% of their needs  MONITOR:   Vent status, Labs, Weight trends, I & O's  REASON FOR ASSESSMENT:   Ventilator, Consult Enteral/tube feeding initiation and management  ASSESSMENT:   40 year old male who presented to the ED on 6/29 with a sore throat and difficulty swallowing. No PMH documented. Pt admitted with epiglottitis with airway edema and swelling. Pt required nasal-tracheal intubation.  Consult received for tube feeding initiation and management. Pt does not have enteral access at this time. Discussed pt with RN and during ICU rounds. Plan is to hold off on placing NG tube or OG tube until ENT has been able to reevaluate pt as pt is coughing up blood. RD will leave tube feeding recommendations.  No family present at time of RD visit. Pt awoke briefly to RD touch. Unable to obtain diet and weight history at this time. Current weight of 95.5 kg is up 5 kg from weight on 10/22/20.  Patient is currently nasal-tracheal intubated on ventilator support MV: 10.0 L/min Temp (24hrs), Avg:98.3 F (36.8 C), Min:97.7 F (36.5 C), Max:98.8 F (37.1 C)  Drips: Propofol: 28.6 ml/hr (provides 755 kcal daily from lipid) Fentanyl  Medications reviewed and include: IV benadryl, colace, SSI q 4 hours, miralax, IV abx, IV pepcid  Labs reviewed: sodium 133, TG 258 CBG's: 159-163 x 24 hours  UOP:  2300 ml x 24 hours I/O's: -55 ml since admit  NUTRITION - FOCUSED PHYSICAL EXAM:  Flowsheet Row Most Recent Value  Orbital Region No depletion  Upper Arm Region No depletion  Thoracic and Lumbar Region No depletion  Buccal Region No depletion  Temple Region No depletion  Clavicle Bone Region No depletion  Clavicle and Acromion Bone Region No depletion  Scapular Bone Region No depletion  Dorsal Hand No depletion  Patellar Region No depletion  Anterior Thigh Region No depletion  Posterior Calf Region No depletion  Edema (RD Assessment) None  Hair Reviewed  Eyes Reviewed  Mouth Reviewed  Skin Reviewed  Nails Reviewed       Diet Order:   Diet Order             Diet NPO time specified  Diet effective now                   EDUCATION NEEDS:   No education needs have been identified at this time  Skin:  Skin Assessment: Reviewed RN Assessment  Last BM:  no documented BM  Height:   Ht Readings from Last 1 Encounters:  12/20/20 5\' 9"  (1.753 m)    Weight:   Wt Readings from Last 1 Encounters:  12/21/20 95.5 kg    Ideal Body Weight:  72.7 kg  BMI:  Body mass index is 31.09 kg/m.  Estimated Nutritional Needs:   Kcal:  2150  Protein:  130-150 grams  Fluid:  >/= 2.0 L    2151  Vertell Limber, MS, RD, LDN Inpatient Clinical Dietitian Please see AMiON for contact information.

## 2020-12-21 NOTE — Progress Notes (Signed)
eLink Physician-Brief Progress Note Patient Name: Richard Nash DOB: 09/24/1980 MRN: [id number]   Date of Service  12/21/2020  HPI/Events of Note  Patient with urinary retention, bladder scan positive to the tune of 900 ml.  eICU Interventions  Order to insert Foley catheter entered.        Thomasene Lot Shondell Fabel 12/21/2020, 5:40 AM

## 2020-12-21 NOTE — Progress Notes (Signed)
Subjective: Intubated yesterday for epiglottitis.  Doing fine.  Objective: Vital signs in last 24 hours: Temp:  [97.7 F (36.5 C)-98.8 F (37.1 C)] 98.5 F (36.9 C) (06/30 1554) Pulse Rate:  [66-88] 79 (06/30 1600) Resp:  [18-22] 18 (06/30 1600) BP: (103-140)/(72-100) 128/89 (06/30 1600) SpO2:  [92 %-97 %] 95 % (06/30 1645) FiO2 (%):  [30 %] 30 % (06/30 1645) Weight:  [95.5 kg] 95.5 kg (06/30 0610) Wt Readings from Last 1 Encounters:  12/21/20 95.5 kg    Intake/Output from previous day: 06/29 0701 - 06/30 0700 In: 2244.6 [I.V.:1144.6; IV Piggyback:1100] Out: 2300 [Urine:2300] Intake/Output this shift: Total I/O In: 544.6 [I.V.:444.6; IV Piggyback:100] Out: 535 [Urine:535]  General appearance: sedated, nasal intubation Throat: no bleeding  Recent Labs    12/20/20 0421 12/20/20 1521 12/21/20 0510  WBC 19.4*  --  17.0*  HGB 16.3 15.3 14.7  HCT 46.6 45.0 42.8  PLT 327  --  298    Recent Labs    12/20/20 0421 12/20/20 1521 12/21/20 0510  NA 136 135 133*  K 3.6 4.2 3.8  CL 98  --  98  CO2 23  --  24  GLUCOSE 114*  --  157*  BUN 13  --  17  CREATININE 0.99  --  1.05  CALCIUM 9.8  --  9.6    Medications: I have reviewed the patient's current medications.  Assessment/Plan: Epiglottitis s/p nasal intubation due to airway obstruction  Continue antibiotic therapy for epiglottitis.  Check cuff leak daily.  Anticipate potential extubation no earlier than Saturday if cuff leak good and other markers improving.  OK to place feeding tube.   LOS: 1 day   Christia Reading 12/21/2020, 5:38 PM

## 2020-12-21 NOTE — Progress Notes (Addendum)
NAME:  Richard Nash, MRN:  [id number], DOB:  04/17/1981, LOS: 1 ADMISSION DATE:  12/20/2020, CONSULTATION DATE:  6/29 REFERRING MD:  EDP, CHIEF COMPLAINT:  epiglottitis    History of Present Illness:  Richard Nash is a 40 y.o. M with no past medical history who presented to the [facility] ED today with a sore throat and difficulty breathing, who was found to have epiglottitis.   Mr. Richard Nash' last po intake was on 6/28 in the morning. Around 2PM he developed a scratchy throat which did not improve after throat spray and salt water gargles. He went to bed and woke up at 1 AM on 6/29 with shortness of breath. He presented to the [facility] ED and was found to have epiglottitis on CT scan of his throat. He had one episode of emesis at [facility]. He was transferred to [facility] for E[facility] evaluation.   He was evaluated by ENT who have decided to take him to the OR for intubation and possible tracheostomy placement. Workup was noted for a slight leukocytosis, COVID was -, Strep  A was -. Exam was notable for dysphagia and difficulty with secretions. Denies shortness of breath and chest pain. Conversational on [facility] with no tripoding. Denies smoking history. Endorses active mariajuana use.   PCCM was consulted for assistance with mechanical ventilation and ICU management post procedure.   Significant Hospital Events: Including procedures, antibiotic start and stop dates in addition to other pertinent events   6/29 Presented to [facility], CT demonstrated epiglottitis. Fiberoptic nasotracheal intubation in the OR.   Interim History / Subjective:  No sig change overnight.  Foley placed for urinary retention  Comfortable on vent, RASS -1  Objective   Blood pressure 106/73, pulse 66, temperature 97.8 F (36.6 C), temperature source Axillary, resp. rate 18, height 5\' 9"  (1.753 m), weight 95.5 kg, SpO2 92 %.    Vent Mode: PRVC FiO2 (%):  [30 %-60 %] 30 % Set Rate:  [16 bmp-18 bmp] 18 bmp Vt Set:  [560 mL] 560 mL PEEP:  [5 cmH20] 5  cmH20 Plateau Pressure:  [15 cmH20-22 cmH20] 15 cmH20   Intake/Output Summary (Last 24 hours) at 12/21/2020 12/23/2020 Last data filed at 12/21/2020 0800 Gross per 24 hour  Intake 2280.37 ml  Output 2475 ml  Net -194.63 ml   Filed Weights   12/20/20 0343 12/21/20 0610  Weight: 95.3 kg 95.5 kg    Examination: General: wdwn male, NAD on vent  HENT: mm moist, nasal ETT, no obvious oral swelling  Lungs: resps even non labored on vent, clear  Cardiovascular: s1s2 rrr Abdomen: soft, non tender  Extremities: warm and dry, no sig edema  Neuro: sedated on vent, RASS -2 GU: foley   Resolved Hospital Problem list     Assessment & Plan:  Acute Epiglottitis Acute hypoxic Respiratory Failure secondary to Epiglottitis- s/p nasotracheal intubation in the OR with anesthesia and ENT 6/29. ? Infectious v viral.  RVP negative.  Leukocytosis PLAN -  Vent support - 8cc/kg  Continue ceftriaxone for now  Goal plateau pressures of 30 and driving pressures of 15 Wean PEEP/FiO2 for SpO2 92-98 Follow intermittent CXR and ABG PRN PAD bundle with propofol gtt and fentanyl gtt. Goal RASS -2. Titrate to goal.  Will continue mechanical ventilation for at least 24 hours if not longer. Will discuss with ENT, ? Re-scope to assess prior to considering extubation  Initiate VAP precautions S/p Decadron 6mg  q6h for 4 doses,  Continue benadryl 50mg  q8h, pepcid 20mg   q12h - Stop benadryl once extubated Holding on WUA given nasal intubation, difficult airway   Urinary retention  PLAN -  Continue foley  Best Practice (right click and "Reselect all SmartList Selections" daily)   Diet/type: tubefeeds DVT prophylaxis: LMWH GI prophylaxis: H2B Lines: N/A Foley:  Yes, and it is still needed Code Status:  full code Last date of multidisciplinary goals of care discussion [6/30]  Labs   CBC: Recent Labs  Lab 12/20/20 0421 12/20/20 1521 12/21/20 0510  WBC 19.4*  --  17.0*  NEUTROABS 16.6*  --   --   HGB 16.3  15.3 14.7  HCT 46.6 45.0 42.8  MCV 84.3  --  84.6  PLT 327  --  298    Basic Metabolic Panel: Recent Labs  Lab 12/20/20 0421 12/20/20 1521 12/21/20 0510  NA 136 135 133*  K 3.6 4.2 3.8  CL 98  --  98  CO2 23  --  24  GLUCOSE 114*  --  157*  BUN 13  --  17  CREATININE 0.99  --  1.05  CALCIUM 9.8  --  9.6  MG  --   --  2.1   GFR: Estimated Creatinine Clearance: 107.7 mL/min (by C-G formula based on SCr of 1.05 mg/dL). Recent Labs  Lab 12/20/20 0421 12/21/20 0510  WBC 19.4* 17.0*    Liver Function Tests: No results for input(s): AST, ALT, ALKPHOS, BILITOT, PROT, ALBUMIN in the last 168 hours. No results for input(s): LIPASE, AMYLASE in the last 168 hours. No results for input(s): AMMONIA in the last 168 hours.  ABG    Component Value Date/Time   PHART 7.422 12/20/2020 1521   PCO2ART 43.2 12/20/2020 1521   PO2ART 120 (H) 12/20/2020 1521   HCO3 28.1 (H) 12/20/2020 1521   TCO2 29 12/20/2020 1521   O2SAT 99.0 12/20/2020 1521     Coagulation Profile: No results for input(s): INR, PROTIME in the last 168 hours.  Cardiac Enzymes: No results for input(s): CKTOTAL, CKMB, CKMBINDEX, TROPONINI in the last 168 hours.  HbA1C: Hgb A1c MFr Bld  Date/Time Value Ref Range Status  12/20/2020 04:14 PM 5.3 4.8 - 5.6 % Final    Comment:    (NOTE)         Prediabetes: 5.7 - 6.4         Diabetes: >6.4         Glycemic control for adults with diabetes: <7.0     CBG: Recent Labs  Lab 12/20/20 1517 12/20/20 1934 12/20/20 2330 12/21/20 0342 12/21/20 0728  GLUCAP 159* 163* 159* 161* 159*    Critical care time:   Dirk Dress, NP Pulmonary/Critical Care Medicine  12/21/2020  8:52 AM

## 2020-12-22 ENCOUNTER — Inpatient Hospital Stay (HOSPITAL_COMMUNITY): Payer: BC Managed Care – PPO

## 2020-12-22 DIAGNOSIS — Z9911 Dependence on respirator [ventilator] status: Secondary | ICD-10-CM

## 2020-12-22 DIAGNOSIS — Z978 Presence of other specified devices: Secondary | ICD-10-CM

## 2020-12-22 LAB — GLUCOSE, CAPILLARY
Glucose-Capillary: 106 mg/dL — ABNORMAL HIGH (ref 70–99)
Glucose-Capillary: 107 mg/dL — ABNORMAL HIGH (ref 70–99)
Glucose-Capillary: 114 mg/dL — ABNORMAL HIGH (ref 70–99)
Glucose-Capillary: 132 mg/dL — ABNORMAL HIGH (ref 70–99)
Glucose-Capillary: 93 mg/dL (ref 70–99)
Glucose-Capillary: 93 mg/dL (ref 70–99)
Glucose-Capillary: 93 mg/dL (ref 70–99)

## 2020-12-22 LAB — CBC
HCT: 38.8 % — ABNORMAL LOW (ref 39.0–52.0)
Hemoglobin: 13.2 g/dL (ref 13.0–17.0)
MCH: 29.3 pg (ref 26.0–34.0)
MCHC: 34 g/dL (ref 30.0–36.0)
MCV: 86 fL (ref 80.0–100.0)
Platelets: 274 10*3/uL (ref 150–400)
RBC: 4.51 MIL/uL (ref 4.22–5.81)
RDW: 13.1 % (ref 11.5–15.5)
WBC: 13.8 10*3/uL — ABNORMAL HIGH (ref 4.0–10.5)
nRBC: 0 % (ref 0.0–0.2)

## 2020-12-22 LAB — COMPREHENSIVE METABOLIC PANEL
ALT: 22 U/L (ref 0–44)
AST: 17 U/L (ref 15–41)
Albumin: 3.5 g/dL (ref 3.5–5.0)
Alkaline Phosphatase: 46 U/L (ref 38–126)
Anion gap: 11 (ref 5–15)
BUN: 26 mg/dL — ABNORMAL HIGH (ref 6–20)
CO2: 24 mmol/L (ref 22–32)
Calcium: 9.1 mg/dL (ref 8.9–10.3)
Chloride: 101 mmol/L (ref 98–111)
Creatinine, Ser: 1.06 mg/dL (ref 0.61–1.24)
GFR, Estimated: >60 mL/min (ref >60)
Glucose, Bld: 105 mg/dL — ABNORMAL HIGH (ref 70–99)
Potassium: 3.5 mmol/L (ref 3.5–5.1)
Sodium: 136 mmol/L (ref 135–145)
Total Bilirubin: 0.7 mg/dL (ref 0.3–1.2)
Total Protein: 6.5 g/dL (ref 6.5–8.1)

## 2020-12-22 LAB — MAGNESIUM
Magnesium: 2.2 mg/dL (ref 1.7–2.4)
Magnesium: 2.2 mg/dL (ref 1.7–2.4)
Magnesium: 2.1 mg/dL (ref 1.7–2.4)

## 2020-12-22 LAB — PHOSPHORUS
Phosphorus: 3.4 mg/dL (ref 2.5–4.6)
Phosphorus: 2.2 mg/dL — ABNORMAL LOW (ref 2.5–4.6)
Phosphorus: 3.3 mg/dL (ref 2.5–4.6)

## 2020-12-22 MED ORDER — POLYETHYLENE GLYCOL 3350 17 G PO PACK
17.0000 g | PACK | Freq: Every day | ORAL | Status: DC
  Administered 2020-12-22: 17 g
  Filled 2020-12-22: qty 1

## 2020-12-22 MED ORDER — VITAL HIGH PROTEIN PO LIQD: 1000.0000 mL | ORAL | Status: DC

## 2020-12-22 MED ORDER — SENNA 8.6 MG PO TABS
1.0000 | ORAL_TABLET | Freq: Every day | ORAL | Status: DC
  Administered 2020-12-22: 8.6 mg
  Filled 2020-12-22: qty 1

## 2020-12-22 MED ORDER — POTASSIUM PHOSPHATES 15 MMOLE/5ML IV SOLN
15.0000 mmol | Freq: Once | INTRAVENOUS | Status: AC
  Administered 2020-12-22: 15 mmol via INTRAVENOUS
  Filled 2020-12-22: qty 5

## 2020-12-22 MED ORDER — VITAL HIGH PROTEIN PO LIQD
1000.0000 mL | ORAL | Status: DC
  Administered 2020-12-22: 1000 mL
  Filled 2020-12-22: qty 1000

## 2020-12-22 MED ORDER — PROSOURCE TF PO LIQD
45.0000 mL | Freq: Two times a day (BID) | ORAL | Status: DC
  Administered 2020-12-22 (×2): 45 mL
  Filled 2020-12-22 (×2): qty 45

## 2020-12-22 MED ORDER — BETHANECHOL CHLORIDE 10 MG PO TABS
10.0000 mg | ORAL_TABLET | Freq: Three times a day (TID) | ORAL | Status: AC
  Administered 2020-12-22 – 2020-12-24 (×6): 10 mg
  Filled 2020-12-22 (×9): qty 1

## 2020-12-22 MED ORDER — VANCOMYCIN HCL 2000 MG/400ML IV SOLN
2000.0000 mg | INTRAVENOUS | Status: DC
  Administered 2020-12-23: 2000 mg via INTRAVENOUS
  Filled 2020-12-22 (×2): qty 400

## 2020-12-22 MED ORDER — ENOXAPARIN SODIUM 60 MG/0.6ML IJ SOSY
50.0000 mg | PREFILLED_SYRINGE | INTRAMUSCULAR | Status: DC
  Administered 2020-12-23 – 2020-12-25 (×3): 50 mg via SUBCUTANEOUS
  Filled 2020-12-22 (×3): qty 0.6

## 2020-12-22 MED ORDER — VANCOMYCIN HCL 2000 MG/400ML IV SOLN
2000.0000 mg | Freq: Once | INTRAVENOUS | Status: AC
  Administered 2020-12-22: 2000 mg via INTRAVENOUS
  Filled 2020-12-22: qty 400

## 2020-12-22 NOTE — Progress Notes (Signed)
Patient was bathed and rolled independently in the bed. Patient then sat in chair position for approximately 20 minutes. Patient then independently swung feet around to sit edge of bed. Patient sat edge of bed for approximately 10 minutes. Patient then stood up at the edge of bed, remained standing for 1-2 minutes and sat back down. Patient sat edge of bed for another 5 minutes and repeated the standing at edge of bed. Patient then independently swung legs back into bed and positioned himself in the bed where he was comfortable. Patient's restraints were removed for this time period of range of motion/ physical movement with the direct supervision of this RN. Restraints were reapplied as soon as the patient was safely back in bed.

## 2020-12-22 NOTE — Procedures (Signed)
Cortrak  Person Inserting Tube:  Richard Nash, Richard Nash, RD Tube Type:  Cortrak - 43 inches Tube Size:  10 Tube Location:  Right nare Initial Placement:  Stomach Secured by: Clip Technique Used to Measure Tube Placement:  Marking at nare/corner of mouth Cortrak Secured At:  65 cm  Cortrak Tube Team Note:  Consult received to place a Cortrak feeding tube.   X-ray is required, abdominal x-ray has been ordered by the Cortrak team. Please confirm tube placement before using the Cortrak tube.   If the tube becomes dislodged please keep the tube and contact the Cortrak team at [email or web address] ([email or web address]) for replacement.  If after hours and replacement cannot be delayed, place a NG tube and confirm placement with an abdominal x-ray.    Richard Gavia, MS, RD, LDN (she/her/hers) RD pager number and weekend/on-call pager number located in Amion.

## 2020-12-22 NOTE — Progress Notes (Signed)
Nutrition Follow-up  DOCUMENTATION CODES:   Not applicable  INTERVENTION:   Once Cortrak NG tube placed, initiate tube feeds: - Vital High Protein @ 60 ml/hr (1440 ml/day) - ProSource TF 45 ml BID  Tube feeding regimen provides 1520 kcal, 148 grams of protein, and 1204 ml of H2O.   Tube feeding regimen and current propofol provides 2198 total kcal (98% of needs).  NUTRITION DIAGNOSIS:   Inadequate oral intake related to inability to eat as evidenced by NPO status.  Ongoing, being addressed via TF  GOAL:   Patient will meet greater than or equal to 90% of their needs  Met via TF  MONITOR:   Vent status, Labs, Weight trends, I & O's  REASON FOR ASSESSMENT:   Ventilator, Consult Enteral/tube feeding initiation and management  ASSESSMENT:   40 year old male who presented to the ED on 6/29 with a sore throat and difficulty swallowing. No PMH documented. Pt admitted with epiglottitis with airway edema and swelling. Pt required nasal-tracheal intubation.  Per notes, considering extubation on 7/2. Discussed pt with RN and during ICU rounds. Consult received for tube feeding initiation and management. Cortrak NG tube to be placed today.  Admit weight: 95.5 kg Current weight: 96.2 kg  Patient is currently nasal-tracheal intubated on ventilator support MV: 16.2 L/min Temp (24hrs), Avg:97.9 F (36.6 C), Min:97.5 F (36.4 C), Max:98.5 F (36.9 C)  Drips: Propofol: 25.7 ml/hr (provides 678 kcal daily from lipid) Fentanyl  Medications reviewed and include: IV benadryl, SSI q 4 hours, IV abx, IV pepcid, miralax, senna, IV potassium phosphate 15 mmol once  Labs reviewed: BUN 26, phosphorus 2.2 CBG's: 93-146 x 24 hours  UOP: 1035 ml x 24 hours I/O's: +242 ml since admit  Diet Order:   Diet Order             Diet NPO time specified  Diet effective now                   EDUCATION NEEDS:   No education needs have been identified at this time  Skin:  Skin  Assessment: Reviewed RN Assessment  Last BM:  no documented BM  Height:   Ht Readings from Last 1 Encounters:  12/20/20 _0  (1.753 m)    Weight:   Wt Readings from Last 1 Encounters:  12/22/20 96.2 kg    Ideal Body Weight:  72.7 kg  BMI:  Body mass index is 31.32 kg/m.  Estimated Nutritional Needs:   Kcal:  2250  Protein:  130-150 grams  Fluid:  >/= 2.0 L    Gustavus Bryant, MS, RD, LDN Inpatient Clinical Dietitian Please see [facility] for contact information.

## 2020-12-22 NOTE — Progress Notes (Signed)
RT NOTE: patient placed on CPAP/PSV of 8/5 at 0735.  Currently tolerating well.  Will continue to monitor.

## 2020-12-22 NOTE — Progress Notes (Signed)
Pharmacy Antibiotic Note  Richard Nash is a 40 y.o. male admitted on 12/20/2020 with sore throat and difficulty breathing found to have epiglottitis and intubated. Patient has been receiving ceftriaxone. Viral panel negative. Pharmacy has been consulted for vancomycin dosing.  Plan: Vancomycin 2000mg  x1 loading dose followed by vancomycin 2000mg  q24h (eAUC 507 using Scr 1.06 and Vd 0.5 for BMI 31) Monitor renal function, length of therapy, and clinical progression   Height: 5\' 9"  (175.3 cm) Weight: 96.2 kg (212 lb 1.3 oz) IBW/kg (Calculated) : 70.7  Temp (24hrs), Avg:97.9 F (36.6 C), Min:97.5 F (36.4 C), Max:98.5 F (36.9 C)  Recent Labs  Lab 12/20/20 0421 12/21/20 0510 12/22/20 0626  WBC 19.4* 17.0* 13.8*  CREATININE 0.99 1.05 1.06    Estimated Creatinine Clearance: 107.1 mL/min (by C-G formula based on SCr of 1.06 mg/dL).    Allergies  Allergen Reactions   Prednisone Other (See Comments)    Pt not able to tolerate; had several side effects.    Antimicrobials this admission: Ceftriaxone 6/29 >>  Vancomycin 7/1 >>   Dose adjustments this admission:   Microbiology results: 6/29 resp panel: negative   Thank you for allowing pharmacy to be a part of this patient's care.  7/29, PharmD Clinical Pharmacist  12/22/2020 9:05 AM

## 2020-12-22 NOTE — Progress Notes (Signed)
RT NOTE: patient was placed back on full support ventilation due to decreased RR when asleep.  Tolerating well at this time.  Will continue to monitor.

## 2020-12-22 NOTE — Progress Notes (Signed)
NAME:  Richard Nash, MRN:  [id number], DOB:  1981-05-18, LOS: 2 ADMISSION DATE:  12/20/2020, CONSULTATION DATE:  6/29 REFERRING MD:  EDP, CHIEF COMPLAINT:  epiglottitis    History of Present Illness:  Richard Nash is a 40 y.o. M with no past medical history who presented to the [facility] ED today with a sore throat and difficulty breathing, who was found to have epiglottitis.   [name]' last po intake was on 6/28 in the morning. Around 2PM he developed a scratchy throat which did not improve after throat spray and salt water gargles. He went to bed and woke up at 1 AM on 6/29 with shortness of breath. He presented to the [facility] ED and was found to have epiglottitis on CT scan of his throat. He had one episode of emesis at [facility]. He was transferred to [facility] for ENT evaluation.   He was evaluated by ENT who have decided to take him to the OR for intubation and possible tracheostomy placement. Workup was noted for a slight leukocytosis, COVID was -, Strep  A was -. Exam was notable for dysphagia and difficulty with secretions. Denies shortness of breath and chest pain. Conversational on [facility] with no tripoding. Denies smoking history. Endorses active mariajuana use.   PCCM was consulted for assistance with mechanical ventilation and ICU management post procedure.   Significant Hospital Events: Including procedures, antibiotic start and stop dates in addition to other pertinent events   6/29 Presented to [facility], CT demonstrated epiglottitis. Fiberoptic nasotracheal intubation in the OR.   Interim History / Subjective:  No sig change overnight.  Awake on low dose sedation. Tol PS 8/5.   Objective   Blood pressure 122/87, pulse 61, temperature 98 F (36.7 C), temperature source Oral, resp. rate 10, height 5\' 9"  (1.753 m), weight 96.2 kg, SpO2 99 %.    Vent Mode: PSV;CPAP FiO2 (%):  [30 %] 30 % Set Rate:  [18 bmp] 18 bmp Vt Set:  [560 mL] 560 mL PEEP:  [5 cmH20] 5 cmH20 Pressure Support:  [8 cmH20] 8  cmH20 Plateau Pressure:  [16 cmH20-17 cmH20] 17 cmH20   Intake/Output Summary (Last 24 hours) at 12/22/2020 0858 Last data filed at 12/22/2020 0800 Gross per 24 hour  Intake 1347 ml  Output 935 ml  Net 412 ml   Filed Weights   12/20/20 0343 12/21/20 0610 12/22/20 0359  Weight: 95.3 kg 95.5 kg 96.2 kg    Examination: General: wdwn male, NAD on vent  HENT: mm moist, nasal ETT, no obvious oral swelling  Lungs: resps even non labored on vent, clear  Cardiovascular: s1s2 rrr Abdomen: soft, non tender  Extremities: warm and dry, no sig edema  Neuro: awake, appropriate, MAE, RASS 0 GU: foley   Resolved Hospital Problem list     Assessment & Plan:  Acute Epiglottitis Acute hypoxic Respiratory Failure secondary to Epiglottitis- s/p nasotracheal intubation in the OR with anesthesia and ENT 6/29. ? Infectious v viral.  RVP negative.  Leukocytosis PLAN -  Vent support - 8cc/kg  Continue ceftriaxone for now  Add vanc  Continue PS wean as tol, full support qhs  Goal plateau pressures of 30 and driving pressures of 15 Wean PEEP/FiO2 for SpO2 92-98 Follow intermittent CXR and ABG PRN PAD bundle with propofol gtt and fentanyl gtt. Goal RASS 0. Titrate to goal.  Will continue mechanical ventilation for at least 24 hours if not longer. Will discuss with ENT, ? Re-scope to assess prior to considering  extubation  VAP precautions S/p Decadron 6mg  q6h for 4 doses,  Continue benadryl 50mg  q8h, pepcid 20mg  q12h - Stop benadryl once extubated ENT following -- check cuff leak again 7/2 and consider extubation   Urinary retention  PLAN -  Continue foley  Best Practice (right click and "Reselect all SmartList Selections" daily)   Diet/type: tubefeeds DVT prophylaxis: LMWH GI prophylaxis: H2B Lines: N/A Foley:  Yes, and it is still needed Code Status:  full code Last date of multidisciplinary goals of care discussion [6/30]  Labs   CBC: Recent Labs  Lab 12/20/20 0421 12/20/20 1521  12/21/20 0510 12/22/20 0626  WBC 19.4*  --  17.0* 13.8*  NEUTROABS 16.6*  --   --   --   HGB 16.3 15.3 14.7 13.2  HCT 46.6 45.0 42.8 38.8*  MCV 84.3  --  84.6 86.0  PLT 327  --  298 274    Basic Metabolic Panel: Recent Labs  Lab 12/20/20 0421 12/20/20 1521 12/21/20 0510 12/21/20 0955 12/21/20 1632 12/22/20 0626  NA 136 135 133*  --   --  136  K 3.6 4.2 3.8  --   --  3.5  CL 98  --  98  --   --  101  CO2 23  --  24  --   --  24  GLUCOSE 114*  --  157*  --   --  105*  BUN 13  --  17  --   --  26*  CREATININE 0.99  --  1.05  --   --  1.06  CALCIUM 9.8  --  9.6  --   --  9.1  MG  --   --  2.1 2.2 2.2 2.2  PHOS  --   --   --  4.0 3.9 3.4   GFR: Estimated Creatinine Clearance: 107.1 mL/min (by C-G formula based on SCr of 1.06 mg/dL). Recent Labs  Lab 12/20/20 0421 12/21/20 0510 12/22/20 0626  WBC 19.4* 17.0* 13.8*    Liver Function Tests: Recent Labs  Lab 12/22/20 0626  AST 17  ALT 22  ALKPHOS 46  BILITOT 0.7  PROT 6.5  ALBUMIN 3.5   No results for input(s): LIPASE, AMYLASE in the last 168 hours. No results for input(s): AMMONIA in the last 168 hours.  ABG    Component Value Date/Time   PHART 7.422 12/20/2020 1521   PCO2ART 43.2 12/20/2020 1521   PO2ART 120 (H) 12/20/2020 1521   HCO3 28.1 (H) 12/20/2020 1521   TCO2 29 12/20/2020 1521   O2SAT 99.0 12/20/2020 1521     Coagulation Profile: No results for input(s): INR, PROTIME in the last 168 hours.  Cardiac Enzymes: No results for input(s): CKTOTAL, CKMB, CKMBINDEX, TROPONINI in the last 168 hours.  HbA1C: Hgb A1c MFr Bld  Date/Time Value Ref Range Status  12/20/2020 04:14 PM 5.3 4.8 - 5.6 % Final    Comment:    (NOTE)         Prediabetes: 5.7 - 6.4         Diabetes: >6.4         Glycemic control for adults with diabetes: <7.0     CBG: Recent Labs  Lab 12/21/20 1542 12/21/20 1948 12/22/20 0014 12/22/20 0405 12/22/20 0757  GLUCAP 146* 131* 132* 107* 93    Critical care time: 31  mins   02/22/21, NP Pulmonary/Critical Care Medicine  12/22/2020  8:58 AM

## 2020-12-23 LAB — BASIC METABOLIC PANEL
Anion gap: 12 (ref 5–15)
BUN: 27 mg/dL — ABNORMAL HIGH (ref 6–20)
CO2: 22 mmol/L (ref 22–32)
Calcium: 8.8 mg/dL — ABNORMAL LOW (ref 8.9–10.3)
Chloride: 101 mmol/L (ref 98–111)
Creatinine, Ser: 1 mg/dL (ref 0.61–1.24)
GFR, Estimated: >60 mL/min (ref >60)
Glucose, Bld: 106 mg/dL — ABNORMAL HIGH (ref 70–99)
Potassium: 3.2 mmol/L — ABNORMAL LOW (ref 3.5–5.1)
Sodium: 135 mmol/L (ref 135–145)

## 2020-12-23 LAB — GLUCOSE, CAPILLARY
Glucose-Capillary: 101 mg/dL — ABNORMAL HIGH (ref 70–99)
Glucose-Capillary: 114 mg/dL — ABNORMAL HIGH (ref 70–99)
Glucose-Capillary: 122 mg/dL — ABNORMAL HIGH (ref 70–99)
Glucose-Capillary: 162 mg/dL — ABNORMAL HIGH (ref 70–99)
Glucose-Capillary: 139 mg/dL — ABNORMAL HIGH (ref 70–99)

## 2020-12-23 LAB — MAGNESIUM
Magnesium: 2.1 mg/dL (ref 1.7–2.4)
Magnesium: 2.5 mg/dL — ABNORMAL HIGH (ref 1.7–2.4)

## 2020-12-23 LAB — TRIGLYCERIDES: Triglycerides: 595 mg/dL — ABNORMAL HIGH (ref <150)

## 2020-12-23 LAB — CBC
HCT: 36.3 % — ABNORMAL LOW (ref 39.0–52.0)
Hemoglobin: 12.7 g/dL — ABNORMAL LOW (ref 13.0–17.0)
MCH: 29.8 pg (ref 26.0–34.0)
MCHC: 35 g/dL (ref 30.0–36.0)
MCV: 85.2 fL (ref 80.0–100.0)
Platelets: 241 10*3/uL (ref 150–400)
RBC: 4.26 MIL/uL (ref 4.22–5.81)
RDW: 13 % (ref 11.5–15.5)
WBC: 7.7 10*3/uL (ref 4.0–10.5)
nRBC: 0 % (ref 0.0–0.2)

## 2020-12-23 LAB — PHOSPHORUS
Phosphorus: 3.5 mg/dL (ref 2.5–4.6)
Phosphorus: 4.1 mg/dL (ref 2.5–4.6)

## 2020-12-23 MED ORDER — POTASSIUM CHLORIDE 10 MEQ/100ML IV SOLN
10.0000 meq | INTRAVENOUS | Status: AC
Start: 2020-12-23 — End: 2020-12-23
  Administered 2020-12-23 (×2): 10 meq via INTRAVENOUS
  Filled 2020-12-23 (×4): qty 100

## 2020-12-23 MED ORDER — MAGNESIUM SULFATE 2 GM/50ML IV SOLN
2.0000 g | Freq: Once | INTRAVENOUS | Status: AC
  Administered 2020-12-23: 2 g via INTRAVENOUS
  Filled 2020-12-23: qty 50

## 2020-12-23 MED ORDER — POTASSIUM CHLORIDE 20 MEQ PO PACK
20.0000 meq | PACK | ORAL | Status: DC
  Administered 2020-12-23: 20 meq
  Filled 2020-12-23: qty 1

## 2020-12-23 MED ORDER — PHENOL 1.4 % MT LIQD
1.0000 | OROMUCOSAL | Status: DC | PRN
  Filled 2020-12-23: qty 177

## 2020-12-23 MED ORDER — POTASSIUM CHLORIDE 10 MEQ/100ML IV SOLN
10.0000 meq | INTRAVENOUS | Status: AC
  Administered 2020-12-23: 10 meq via INTRAVENOUS
  Filled 2020-12-23 (×2): qty 100

## 2020-12-23 MED ORDER — OXYMETAZOLINE HCL 0.05 % NA SOLN
1.0000 | Freq: Two times a day (BID) | NASAL | Status: DC
  Administered 2020-12-23 – 2020-12-25 (×3): 1 via NASAL
  Filled 2020-12-23: qty 30

## 2020-12-23 MED ORDER — METHYLPREDNISOLONE SODIUM SUCC 125 MG IJ SOLR
80.0000 mg | Freq: Once | INTRAMUSCULAR | Status: AC
  Administered 2020-12-23: 80 mg via INTRAVENOUS
  Filled 2020-12-23: qty 2

## 2020-12-23 MED ORDER — RACEPINEPHRINE HCL 2.25 % IN NEBU
0.5000 mL | INHALATION_SOLUTION | RESPIRATORY_TRACT | Status: DC | PRN
  Filled 2020-12-23 (×2): qty 0.5

## 2020-12-23 NOTE — Progress Notes (Signed)
NAME:  Richard Nash, MRN:  [id number], DOB:  September 04, 1980, LOS: 3 ADMISSION DATE:  12/20/2020, CONSULTATION DATE:  6/29 REFERRING MD:  EDP, CHIEF COMPLAINT:  epiglottitis    History of Present Illness:  Richard Nash is a 40 y.o. M with no past medical history who presented to the [facility] ED today with a sore throat and difficulty breathing, who was found to have epiglottitis.   Richard Nash' last po intake was on 6/28 in the morning. Around 2PM he developed a scratchy throat which did not improve after throat spray and salt water gargles. He went to bed and woke up at 1 AM on 6/29 with shortness of breath. He presented to the [facility] ED and was found to have epiglottitis on CT scan of his throat. He had one episode of emesis at [facility]. He was transferred to [facility] for ENT evaluation.   He was evaluated by ENT who have decided to take him to the OR for intubation and possible tracheostomy placement. Workup was noted for a slight leukocytosis, COVID was -, Strep  A was -. Exam was notable for dysphagia and difficulty with secretions. Denies shortness of breath and chest pain. Conversational on [facility] with no tripoding. Denies smoking history. Endorses active mariajuana use.   PCCM was consulted for assistance with mechanical ventilation and ICU management post procedure.   Significant Hospital Events: Including procedures, antibiotic start and stop dates in addition to other pertinent events   6/29 Presented to [facility], CT demonstrated epiglottitis. Fiberoptic nasotracheal intubation in the OR.  7/1 remains intubated  Interim History / Subjective:  Pain from ETT, but otherwise no new complaints. Was able to stand at bedside yesterday.  Objective   Blood pressure 133/77, pulse 86, temperature 98 F (36.7 C), temperature source Axillary, resp. rate 11, height 5\' 9"  (1.753 m), weight 96.5 kg, SpO2 98 %.    Vent Mode: PCV FiO2 (%):  [30 %] 30 % Set Rate:  [18 bmp] 18 bmp Vt Set:  [560 mL] 560 mL PEEP:  [5 cmH20] 5  cmH20 Pressure Support:  [8 cmH20] 8 cmH20 Plateau Pressure:  [15 cmH20] 15 cmH20   Intake/Output Summary (Last 24 hours) at 12/23/2020 0817 Last data filed at 12/23/2020 0600 Gross per 24 hour  Intake 2777.5 ml  Output 1030 ml  Net 1747.5 ml    Filed Weights   12/21/20 0610 12/22/20 0359 12/23/20 0500  Weight: 95.5 kg 96.2 kg 96.5 kg    Examination: General: ill appearing man sitting up in bed, awake and alert HENT: [place]/AT, eyes anicteric Lungs: mild tachypnea, some rhonchi cleared with suctioning. Thick secretions from ETT. On 5/5 has Vt >1L Cardiovascular: S1S2, RRR Abdomen: soft, ND  Extremities: no edema, cyanosis, or clubbing Neuro: awake and alert, moving all extremities GU: foley draining yellow urine  Resolved Hospital Problem list     Assessment & Plan:  Acute Epiglottitis Acute hypoxic respiratory failure secondary to Epiglottitis- s/p nasotracheal intubation in the OR with anesthesia and ENT 6/29. ? Infectious v viral.  RVP negative.  Leukocytosis PLAN -  LTVV -SAT & SBT- passed. Has cuff leak. Will plan to extubate after ENT evaluation this morning. -Continue ceftriaxone & vanc empirically. -wean FiO2 as able -Pad protocol for sedation; will d/c when extubated. -VAP precautions -solumedrol x 1 today  -Appreciate ENT's assistance  Urinary retention  -Continue foley; remove once extubated and sedation off -urecholine x 3 days  Best Practice (right click and "Reselect all SmartList Selections" daily)  Diet/type: tubefeeds DVT prophylaxis: LMWH GI prophylaxis: H2B Lines: N/A Foley:  Yes, and it is still needed Code Status:  full code Last date of multidisciplinary goals of care discussion [7/2]  Labs   CBC: Recent Labs  Lab 12/20/20 0421 12/20/20 1521 12/21/20 0510 12/22/20 0626 12/23/20 0459  WBC 19.4*  --  17.0* 13.8* 7.7  NEUTROABS 16.6*  --   --   --   --   HGB 16.3 15.3 14.7 13.2 12.7*  HCT 46.6 45.0 42.8 38.8* 36.3*  MCV 84.3  --  84.6  86.0 85.2  PLT 327  --  298 274 241     Basic Metabolic Panel: Recent Labs  Lab 12/20/20 0421 12/20/20 1521 12/21/20 0510 12/21/20 0955 12/21/20 1632 12/22/20 0626 12/22/20 1015 12/22/20 1707 12/23/20 0459  NA 136 135 133*  --   --  136  --   --  135  K 3.6 4.2 3.8  --   --  3.5  --   --  3.2*  CL 98  --  98  --   --  101  --   --  101  CO2 23  --  24  --   --  24  --   --  22  GLUCOSE 114*  --  157*  --   --  105*  --   --  106*  BUN 13  --  17  --   --  26*  --   --  27*  CREATININE 0.99  --  1.05  --   --  1.06  --   --  1.00  CALCIUM 9.8  --  9.6  --   --  9.1  --   --  8.8*  MG  --   --  2.1   < > 2.2 2.2 2.2 2.1 2.1  PHOS  --   --   --    < > 3.9 3.4 2.2* 3.3 3.5   < > = values in this interval not displayed.    GFR: Estimated Creatinine Clearance: 113.6 mL/min (by C-G formula based on SCr of 1 mg/dL). Recent Labs  Lab 12/20/20 0421 12/21/20 0510 12/22/20 0626 12/23/20 0459  WBC 19.4* 17.0* 13.8* 7.7     Liver Function Tests: Recent Labs  Lab 12/22/20 0626  AST 17  ALT 22  ALKPHOS 46  BILITOT 0.7  PROT 6.5  ALBUMIN 3.5    No results for input(s): LIPASE, AMYLASE in the last 168 hours. No results for input(s): AMMONIA in the last 168 hours.  ABG    Component Value Date/Time   PHART 7.422 12/20/2020 1521   PCO2ART 43.2 12/20/2020 1521   PO2ART 120 (H) 12/20/2020 1521   HCO3 28.1 (H) 12/20/2020 1521   TCO2 29 12/20/2020 1521   O2SAT 99.0 12/20/2020 1521      This patient is critically ill with multiple organ system failure which requires frequent high complexity decision making, assessment, support, evaluation, and titration of therapies. This was completed through the application of advanced monitoring technologies and extensive interpretation of multiple databases. During this encounter critical care time was devoted to patient care services described in this note for 40 minutes.   Steffanie Dunn, DO 12/23/20 8:41 AM [facility]

## 2020-12-23 NOTE — Progress Notes (Addendum)
Around 0400 called to the room per RN that patient vent is excessively alarming. On arrival to patient room, noted frequent breath stacking and dynamic hyperinflation. Patient appears to be stable. Saturations are stable. Patient is alert and oriented and appears to be in no distress. Patient has been completely stable on the vent throughout the night and noted no acute changes in his ventilatory graphics or auto-peeping during his routine Q4 vent check rounds. Now patient is asynchronous w/ the ventilator. Refer to flowsheet for ventilatory measurements. Attempted to negate his auto-peeping by extending his e-time and decrease I-time w/o any improvement. Despite pharmacological interventions of PRN fentanyl given per RN no improvement in auto-peeping. Patient is now on PCV tolerating it fairly well.   Goal of care is to maintain good ventilation and optimize oxygenation via utilization of mechanical ventilation using appropriate settings to meet systemic and myocardial oxygen demands to assure adequate tissue perfusion. Plan is to assess patient readiness for extubation this morning.   Carys Malina L. Keanu Frickey, BS, RRT-ACCS, RCP 12/23/2020, [phone]

## 2020-12-23 NOTE — Progress Notes (Addendum)
Subjective: Has been doing well.  Good cuff leak.  Awake.  Objective: Vital signs in last 24 hours: Temp:  [98 F (36.7 C)-99.4 F (37.4 C)] 98 F (36.7 C) (07/02 0800) Pulse Rate:  [64-96] 86 (07/02 0800) Resp:  [10-24] 11 (07/02 0800) BP: (101-136)/(67-101) 133/77 (07/02 0800) SpO2:  [93 %-99 %] 98 % (07/02 0800) FiO2 (%):  [30 %] 30 % (07/02 0741) Weight:  [96.5 kg] 96.5 kg (07/02 0500) Wt Readings from Last 1 Encounters:  12/23/20 96.5 kg    Intake/Output from previous day: 07/01 0701 - 07/02 0700 In: 2824.5 [I.V.:1033.6; NG/GT:933; IV Piggyback:857.9] Out: 1105 [Urine:1105] Intake/Output this shift: No intake/output data recorded.  General appearance: alert, cooperative, and no distress Throat: present for extubation, easy breathing after without stridor and with good cough , minor nose bleeding self-limited  Recent Labs    12/22/20 0626 12/23/20 0459  WBC 13.8* 7.7  HGB 13.2 12.7*  HCT 38.8* 36.3*  PLT 274 241    Recent Labs    12/22/20 0626 12/23/20 0459  NA 136 135  K 3.5 3.2*  CL 101 101  CO2 24 22  GLUCOSE 105* 106*  BUN 26* 27*  CREATININE 1.06 1.00  CALCIUM 9.1 8.8*    Medications: I have reviewed the patient's current medications.  Assessment/Plan: Epiglottitis  Successful extubation.  Complete antibiotic course.  Advance care per CCM/Hospitalist.   LOS: 3 days   Christia Reading 12/23/2020, 8:51 AM

## 2020-12-23 NOTE — Progress Notes (Signed)
RT NOTE: patient placed on CPAP/PSV of 5/5 at 0740.  Currently tolerating well.  Positive cuff leak noted this AM.  Will continue to monitor.

## 2020-12-23 NOTE — Procedures (Signed)
Extubation Procedure Note  Patient Details:   Name: Richard Nash DOB: Dec 21, 1980 MRN: [id number]   Airway Documentation:    Vent end date: 12/23/20 Vent end time: 0855   Evaluation  O2 sats: stable throughout Complications: No apparent complications Patient did tolerate procedure well. Bilateral Breath Sounds: Clear   Yes  Patient extubated per MD order.  Positive cuff leak noted.  Patient currently on room air with sats of 97%.  Vitals are stable.  Patient able to speak post extubation.  No stridor noted.  Will continue to monitor.   Elyn Peers 12/23/2020, 9:03 AM

## 2020-12-23 NOTE — Progress Notes (Signed)
[facility] ADULT ICU REPLACEMENT PROTOCOL   The patient does apply for the [facility] Adult ICU Electrolyte Replacment Protocol based on the criteria listed below:   1. Is GFR >/= 30 ml/min? Yes.    Patient's GFR today is >60 2. Is SCr </= 2? Yes.   Patient's SCr is 1.0 ml/kg/hr 3. Did SCr increase >/= 0.5 in 24 hours? No. 4. Abnormal electrolyte(s): k (3.2),  (),  () 5. Ordered repletion with: protocol 6. If a panic level lab has been reported, has the CCM MD in charge been notified? No..   Physician:    Markus Daft A 12/23/2020 6:36 AM

## 2020-12-24 DIAGNOSIS — R040 Epistaxis: Secondary | ICD-10-CM

## 2020-12-24 DIAGNOSIS — K5903 Drug induced constipation: Secondary | ICD-10-CM

## 2020-12-24 DIAGNOSIS — R1310 Dysphagia, unspecified: Secondary | ICD-10-CM

## 2020-12-24 LAB — GLUCOSE, CAPILLARY
Glucose-Capillary: 102 mg/dL — ABNORMAL HIGH (ref 70–99)
Glucose-Capillary: 113 mg/dL — ABNORMAL HIGH (ref 70–99)
Glucose-Capillary: 114 mg/dL — ABNORMAL HIGH (ref 70–99)
Glucose-Capillary: 120 mg/dL — ABNORMAL HIGH (ref 70–99)
Glucose-Capillary: 97 mg/dL (ref 70–99)

## 2020-12-24 LAB — BASIC METABOLIC PANEL
Anion gap: 12 (ref 5–15)
BUN: 17 mg/dL (ref 6–20)
CO2: 23 mmol/L (ref 22–32)
Calcium: 9.2 mg/dL (ref 8.9–10.3)
Chloride: 101 mmol/L (ref 98–111)
Creatinine, Ser: 0.8 mg/dL (ref 0.61–1.24)
GFR, Estimated: >60 mL/min (ref >60)
Glucose, Bld: 111 mg/dL — ABNORMAL HIGH (ref 70–99)
Potassium: 3.8 mmol/L (ref 3.5–5.1)
Sodium: 136 mmol/L (ref 135–145)

## 2020-12-24 LAB — CBC
HCT: 41.4 % (ref 39.0–52.0)
Hemoglobin: 14.2 g/dL (ref 13.0–17.0)
MCH: 29.1 pg (ref 26.0–34.0)
MCHC: 34.3 g/dL (ref 30.0–36.0)
MCV: 84.8 fL (ref 80.0–100.0)
Platelets: 293 10*3/uL (ref 150–400)
RBC: 4.88 MIL/uL (ref 4.22–5.81)
RDW: 12.6 % (ref 11.5–15.5)
WBC: 8.8 10*3/uL (ref 4.0–10.5)
nRBC: 0 % (ref 0.0–0.2)

## 2020-12-24 LAB — TRIGLYCERIDES: Triglycerides: 387 mg/dL — ABNORMAL HIGH (ref <150)

## 2020-12-24 MED ORDER — OXYCODONE HCL 5 MG PO TABS: 5.0000 mg | ORAL_TABLET | Freq: Four times a day (QID) | ORAL | Status: DC | PRN

## 2020-12-24 MED ORDER — POLYETHYLENE GLYCOL 3350 17 G PO PACK: 17.0000 g | PACK | Freq: Every day | ORAL | Status: DC | PRN

## 2020-12-24 MED ORDER — ACETAMINOPHEN 500 MG PO TABS
500.0000 mg | ORAL_TABLET | Freq: Four times a day (QID) | ORAL | Status: DC | PRN
  Administered 2020-12-24: 500 mg via ORAL
  Filled 2020-12-24: qty 1

## 2020-12-24 MED ORDER — SODIUM CHLORIDE 0.9 % IV SOLN
100.0000 mg | Freq: Two times a day (BID) | INTRAVENOUS | Status: DC
  Administered 2020-12-24 (×2): 100 mg via INTRAVENOUS
  Filled 2020-12-24 (×6): qty 100

## 2020-12-24 MED ORDER — IBUPROFEN 600 MG PO TABS: 600.0000 mg | ORAL_TABLET | Freq: Four times a day (QID) | ORAL | Status: DC | PRN

## 2020-12-24 MED ORDER — SENNA 8.6 MG PO TABS
1.0000 | ORAL_TABLET | Freq: Every day | ORAL | Status: DC
  Administered 2020-12-24: 8.6 mg via ORAL
  Filled 2020-12-24 (×2): qty 1

## 2020-12-24 NOTE — Progress Notes (Signed)
NAME:  Richard Nash, MRN:  [id number], DOB:  06-24-1981, LOS: 4 ADMISSION DATE:  12/20/2020, CONSULTATION DATE:  6/29 REFERRING MD:  EDP, CHIEF COMPLAINT:  epiglottitis    History of Present Illness:  Richard Nash is a 40 y.o. M with no past medical history who presented to the [facility] ED today with a sore throat and difficulty breathing, who was found to have epiglottitis.   Mr. Pellegrin' last po intake was on 6/28 in the morning. Around 2PM he developed a scratchy throat which did not improve after throat spray and salt water gargles. He went to bed and woke up at 1 AM on 6/29 with shortness of breath. He presented to the [facility] ED and was found to have epiglottitis on CT scan of his throat. He had one episode of emesis at [facility]. He was transferred to [facility] for ENT evaluation.   He was evaluated by ENT who have decided to take him to the OR for intubation and possible tracheostomy placement. Workup was noted for a slight leukocytosis, COVID was -, Strep  A was -. Exam was notable for dysphagia and difficulty with secretions. Denies shortness of breath and chest pain. Conversational on [facility] with no tripoding. Denies smoking history. Endorses active mariajuana use.   PCCM was consulted for assistance with mechanical ventilation and ICU management post procedure.   Significant Hospital Events: Including procedures, antibiotic start and stop dates in addition to other pertinent events   6/29 Presented to [facility], CT demonstrated epiglottitis. Fiberoptic nasotracheal intubation in the OR.  7/1 remains intubated 7/2 extubated, no stridor  Interim History / Subjective:  Still has sore throat limiting PO intake. Wants to try soft diet.   Objective   Blood pressure (!) 156/102, pulse 97, temperature 98.3 F (36.8 C), temperature source Oral, resp. rate (!) 21, height 5\' 9"  (1.753 m), weight 96.5 kg, SpO2 97 %.        Intake/Output Summary (Last 24 hours) at 12/24/2020 0955 Last data filed at 12/24/2020  0900 Gross per 24 hour  Intake 972.6 ml  Output 3300 ml  Net -2327.4 ml    Filed Weights   12/21/20 0610 12/22/20 0359 12/23/20 0500  Weight: 95.5 kg 96.2 kg 96.5 kg    Examination: General: healthy appearing young man lying in bed in NAD HENT: [place]/AT, eyes anicteric, no hoarseness Lungs: CTAB, on RA, no conversational dyspnea Cardiovascular: S1S2, RRR Abdomen:  soft,ND  Extremities: no c/c/e, scars healing well on left hand  Neuro: awake and alert, moving all extremities, answering questions appropriately Derm: warm, dry, no rashes  Resolved Hospital Problem list     Assessment & Plan:  Acute Epiglottitis Acute hypoxic respiratory failure secondary to Epiglottitis- s/p nasotracheal intubation in the OR with anesthesia and ENT 6/29. Infectious v viral.  RVP negative.  Leukocytosis -soft diet; goal is for him to take pills and meet nutritional needs today -ibuprofen, tylenol, oxycodone PRN for pain control. Has tolerated nsaids well in the past without gastritis  -con't ceftriaxone; switching vanc to doxycycline for MRSA coverage. Will switch to oral antibiotics at discharge.  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   Best Practice (right click and "Reselect all SmartList Selections" daily)   Diet/type: tubefeeds DVT prophylaxis:  LM[facility] GI prophylaxis: N/A Lines: N/A Foley:  Yes, and it is still needed Code Status:  full code Last date of multidisciplinary goals of care discussion [7/3]  Labs   CBC: Recent Labs  Lab 12/20/20 0421 12/20/20 1521 12/21/20 0510 12/22/20 0626 12/23/20 0459 12/24/20 0240  WBC 19.4*  --  17.0* 13.8* 7.7 8.8  NEUTROABS 16.6*  --   --   --   --   --   HGB 16.3 15.3  14.7 13.2 12.7* 14.2  HCT 46.6 45.0 42.8 38.8* 36.3* 41.4  MCV 84.3  --  84.6 86.0 85.2 84.8  PLT 327  --  298 274 241 293     Basic Metabolic Panel: Recent Labs  Lab 12/20/20 0421 12/20/20 1521 12/21/20 0510 12/21/20 0955 12/22/20 0626 12/22/20 1015 12/22/20 1707 12/23/20 0459 12/23/20 1629 12/24/20 0240  NA 136 135 133*  --  136  --   --  135  --  136  K 3.6 4.2 3.8  --  3.5  --   --  3.2*  --  3.8  CL 98  --  98  --  101  --   --  101  --  101  CO2 23  --  24  --  24  --   --  22  --  23  GLUCOSE 114*  --  157*  --  105*  --   --  106*  --  111*  BUN 13  --  17  --  26*  --   --  27*  --  17  CREATININE 0.99  --  1.05  --  1.06  --   --  1.00  --  0.80  CALCIUM 9.8  --  9.6  --  9.1  --   --  8.8*  --  9.2  MG  --   --  2.1   < > 2.2 2.2 2.1 2.1 2.5*  --   PHOS  --   --   --    < > 3.4 2.2* 3.3 3.5 4.1  --    < > = values in this interval not displayed.    GFR: Estimated Creatinine Clearance: 142 mL/min (by C-G formula based on SCr of 0.8 mg/dL). Recent Labs  Lab 12/21/20 0510 12/22/20 0626 12/23/20 0459 12/24/20 0240  WBC 17.0* 13.8* 7.7 8.8     Liver Function Tests: Recent Labs  Lab 12/22/20 0626  AST 17  ALT 22  ALKPHOS 46  BILITOT 0.7  PROT 6.5  ALBUMIN 3.5    No results for input(s): LIPASE, AMYLASE in the last 168 hours. No results for input(s): AMMONIA in the last 168 hours.  ABG    Component Value Date/Time   PHART 7.422 12/20/2020 1521   PCO2ART 43.2 12/20/2020 1521   PO2ART 120 (H) 12/20/2020 1521   HCO3 28.1 (H) 12/20/2020 1521   TCO2 29 12/20/2020 1521   O2SAT 99.0 12/20/2020 1521      Steffanie Dunn, DO 12/24/20 10:21 AM [facility]

## 2020-12-25 MED ORDER — IBUPROFEN 600 MG PO TABS: 600.0000 mg | ORAL_TABLET | Freq: Four times a day (QID) | ORAL | 0 refills | Status: AC | PRN

## 2020-12-25 MED ORDER — DOXYCYCLINE HYCLATE 100 MG PO TABS: 100.0000 mg | ORAL_TABLET | Freq: Two times a day (BID) | ORAL | 0 refills | Status: AC

## 2020-12-25 MED ORDER — ACETAMINOPHEN 500 MG PO TABS: 500.0000 mg | ORAL_TABLET | Freq: Four times a day (QID) | ORAL | 0 refills | Status: AC | PRN

## 2020-12-25 MED ORDER — CEFDINIR 300 MG PO CAPS: 300.0000 mg | ORAL_CAPSULE | Freq: Two times a day (BID) | ORAL | 0 refills | Status: AC

## 2020-12-25 MED ORDER — OXYMETAZOLINE HCL 0.05 % NA SOLN: 1.0000 | Freq: Two times a day (BID) | NASAL | 0 refills | Status: AC | PRN

## 2020-12-25 NOTE — Progress Notes (Signed)
Pt refused to receive IV Doxy prior to DC, states his nerves are shot and requests to go on home.  Pt /wife informed about need to call the [facility] clinic for PCP.

## 2020-12-25 NOTE — Discharge Summary (Addendum)
Physician Discharge Summary  Patient ID: Richard Nash MRN: [id number] DOB/AGE: [birth year] 40 y.o.  Admit date: 12/20/2020 Discharge date: 12/25/2020  Admission Diagnoses: Acute epiglottitis with airway obstruction Acute respiratory failure with hypoxia   Discharge Diagnoses:  Active Problems:   Acute epiglottitis without airway obstruction   Acute epiglottitis   Endotracheal tube present Constipation Hyperglycemia Urinary retention Hypertriglyceridemia Epistaxis   Discharged Condition: good  Hospital Course:  Richard Nash presented to the ED with odynophagia and sore throat with the sensation that it was closing. CT demonstrated significant epiglottitis and he was transferred to [facility] for ENT evaluation. He was nasally intubated in the OR and maintained on MV in the ICU from 6/30-7/2. He was given empiric steroids and antibiotics in addition to supplemental nutrition, analgesia and sedation, and supportive care. He was extubated with ENT present on 7/2 uneventfully. Mild epistaxis on the left, but no stridor. He tolerated pills, liquids, and a soft diet after extubation with the ability to meet his needs orally. He will pick up his remaining antibiotics (to complete 10 days) at an open pharmacy before leaving [place]. Constipation and urinary retention resolved prior to discharge.  Consults: pulmonary/intensive care and ENT  Significant Diagnostic Studies:   CT neck: IMPRESSION: 1. Positive for Acute Epiglottitis: Severe mucosal thickening and edema diffusely involving the epiglottis and supraglottic larynx. 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 But there is only trace parapharyngeal space inflammation. 2. Mild, reactive mostly right side cervical lymphadenopathy. 3. Carious posterior right maxillary dentition.   Treatments: IV hydration, antibiotics: vancomycin and ceftriaxone, analgesia, steroids, and respiratory therapy: O2 and mechanical ventilation,  supplemental nutrition  Discharge Exam: Blood pressure 124/90, pulse 76, temperature 97.9 F (36.6 C), temperature source Oral, resp. rate 17, height 5\' 9"  (1.753 m), weight 96.5 kg, SpO2 98 %. General: healthy appearing man lying in bed in NAD HEENT: [place]/AT, eyes anicteric Resp: breathing comfortably on RA, CTAB Cardiac: S1S2, RRR Abd : soft, NT Extremities: no edema or cyanosis. Scars on left arm- healing well Neuro: awake, alert, moving all extremities, no focal deficits Derm: warm, dry, no rashes     Disposition: Discharge disposition: 01-Home or Self Care       Recommendations for PCP: Follow up fasting lipids (specifically triglycerides)  Discharge Instructions     Call MD for:   Complete by: As directed    Fever, worsening sore throat, difficulty swallowing, or trouble breathing.   Diet - low sodium heart healthy   Complete by: As directed    Discharge instructions   Complete by: As directed    Advance diet as tolerated. Avoid the sun while on doxycyline. Follow dietary instructions from the pharmacy on doxycyline. Complete both antibiotics. Follow up with PCP in 1-2 weeks.   Increase activity slowly   Complete by: As directed       Allergies as of 12/25/2020       Reactions   Prednisone Other (See Comments)   Pt not able to tolerate; had several side effects.        Medication List     STOP taking these medications    cyclobenzaprine 10 MG tablet Commonly known as: FLEXERIL   oxyCODONE-acetaminophen 10-325 MG tablet Commonly known as: Percocet       TAKE these medications    acetaminophen 500 MG tablet Commonly known as: TYLENOL Take 1 tablet (500 mg total) by mouth every 6 (six) hours as needed for mild pain, fever or headache.   cefdinir 300  MG capsule Commonly known as: OMNICEF Take 1 capsule (300 mg total) by mouth 2 (two) times daily.   doxycycline 100 MG tablet Commonly known as: VIBRA-TABS Take 1 tablet (100 mg total) by  mouth 2 (two) times daily.   ibuprofen 600 MG tablet Commonly known as: ADVIL Take 1 tablet (600 mg total) by mouth every 6 (six) hours as needed for mild pain or moderate pain. What changed:  medication strength how much to take when to take this reasons to take this   oxymetazoline 0.05 % nasal spray Commonly known as: AFRIN Place 1 spray into left nostril 2 (two) times daily as needed for congestion.         Signed: Steffanie Dunn 12/25/2020, 11:06 AM

## 2020-12-25 NOTE — Plan of Care (Signed)
Pt ready for discharge to home.  Wife with pt, AVS instructions reviewed with pt and copy given.

## 2020-12-25 NOTE — Progress Notes (Signed)
Patient admitted overnight from 82M. Alert and oriented on admission. No c/o pain. VSS.

## 2021-02-02 ENCOUNTER — Ambulatory Visit (INDEPENDENT_AMBULATORY_CARE_PROVIDER_SITE_OTHER): Payer: BC Managed Care – PPO | Admitting: Family Medicine

## 2021-02-02 ENCOUNTER — Ambulatory Visit (INDEPENDENT_AMBULATORY_CARE_PROVIDER_SITE_OTHER): Payer: BC Managed Care – PPO

## 2021-02-02 ENCOUNTER — Other Ambulatory Visit: Payer: Self-pay

## 2021-02-02 ENCOUNTER — Encounter: Payer: Self-pay | Admitting: Family Medicine

## 2021-02-02 VITALS — BP 129/83 | HR 75 | Ht 69.0 in | Wt 221.2 lb

## 2021-02-02 DIAGNOSIS — Z23 Encounter for immunization: Secondary | ICD-10-CM | POA: Diagnosis not present

## 2021-02-02 DIAGNOSIS — S5782XD Crushing injury of left forearm, subsequent encounter: Secondary | ICD-10-CM | POA: Diagnosis not present

## 2021-02-02 DIAGNOSIS — Z114 Encounter for screening for human immunodeficiency virus [HIV]: Secondary | ICD-10-CM

## 2021-02-02 DIAGNOSIS — Z1159 Encounter for screening for other viral diseases: Secondary | ICD-10-CM | POA: Diagnosis not present

## 2021-02-02 DIAGNOSIS — E782 Mixed hyperlipidemia: Secondary | ICD-10-CM | POA: Diagnosis not present

## 2021-02-02 NOTE — Assessment & Plan Note (Signed)
Patient with significant improvement, currently in physical therapy and following up with orthopedic surgery.  Currently not using any pain medications or OTC medications.

## 2021-02-02 NOTE — Progress Notes (Signed)
New Patient Office Visit  Subjective:  Patient ID: Richard Nash, male    DOB: [birth year]  Age: 40 y.o. MRN: [id number]  CC:  Chief Complaint  Patient presents with   Establish Care    HPI Richard Nash presents for establishment of care.  He has no acute concerns at this time.  Does note that he is at a little bit of a rough year, started off with having COVID in January.  In April he had a work injury that resulted in a crush injury of his arm that required surgery.  He is currently in physical therapy for his arm and shoulder with significant improvement. At the end of June, he had epiglottitis and ended up being admitted to the ICU  Past Medical History:  Diagnosis Date   Acute epiglottitis 12/21/2020   Acute epiglottitis without airway obstruction    Endotracheal tube present     Past Surgical History:  Procedure Laterality Date   I & D EXTREMITY Left 09/22/2020   Procedure: IRRIGATION AND DEBRIDEMENT FOREARM AND HAND WITH REPAIR;  Surgeon: Bradly Bienenstock, MD;  Location: [facility] OR;  Service: Orthopedics;  Laterality: Left;   INTUBATION NASOTRACHEAL N/A 12/20/2020   Procedure: ABORTED FIBEROPTIC NASAL-TRACHEAL INTUBATION;  Surgeon: Rejeana Brock, MD;  Location: [facility] OR;  Service: ENT;  Laterality: N/A;    History reviewed. No pertinent family history.  Social History   Socioeconomic History   Marital status: Married    Spouse name: Not on file   Number of children: Not on file   Years of education: Not on file   Highest education level: Not on file  Occupational History   Not on file  Tobacco Use   Smoking status: Never   Smokeless tobacco: Never  Substance and Sexual Activity   Alcohol use: Yes    Comment: occ   Drug use: Yes    Types: Marijuana   Sexual activity: Not Currently  Other Topics Concern   Not on file  Social History Narrative   Not on file   Social Determinants of Health   Financial Resource Strain: Not on file  Food Insecurity: Not on  file  Transportation Needs: Not on file  Physical Activity: Not on file  Stress: Not on file  Social Connections: Not on file  Intimate Partner Violence: Not on file    ROS Review of Systems  Constitutional:  Negative for activity change, fever and unexpected weight change.  HENT:  Negative for congestion, sore throat and trouble swallowing.   Eyes:  Negative for visual disturbance.  Respiratory:  Negative for cough, chest tightness and shortness of breath.   Cardiovascular:  Negative for chest pain and palpitations.  Gastrointestinal:  Negative for abdominal pain, constipation, diarrhea, nausea and vomiting.  Endocrine: Negative for polyuria.  Neurological:  Negative for dizziness, syncope and headaches.   Objective:   Today's Vitals: BP 129/83   Pulse 75   Ht 5\' 9"  (1.753 m)   Wt 221 lb 3.2 oz (100.3 kg)   SpO2 98%   BMI 32.67 kg/m   General -- oriented x3, pleasant and cooperative. HEENT -- Head is normocephalic. PERRLA. EOMI. Ears, nose and throat were benign. Neck -- supple; no bruits. Integument -- healing scars noted on left hand, forearm covered in compression sleeve.  Chest -- good expansion. Lungs clear to auscultation. Cardiac -- RRR. No murmurs noted.  Abdomen -- soft, nontender. No masses palpable. Bowel sounds present. Genital, rectal and breast exam --  deferred. CNS -- cranial nerves II through XII grossly intact.  Extremeties - no tenderness or effusions noted. ROM good. 5/5 bilateral strength.    Assessment & Plan:   Problem List Items Addressed This Visit       Other   Crushing injury of left forearm    Patient with significant improvement, currently in physical therapy and following up with orthopedic surgery.  Currently not using any pain medications or OTC medications.      Elevated cholesterol with elevated triglycerides - Primary    Patient was noted to have hypertriglyceridemia during his hospitalization in the ICU.  Patient was not fasting  today, lab visit created for patient to come back for a fasting lipid panel.  Patient given handout on dietary and lifestyle modifications for decreasing triglycerides.      Relevant Orders   Lipid Panel   Other Visit Diagnoses     Encounter for screening for HIV       Relevant Orders   HIV antibody (with reflex)   Encounter for hepatitis C screening test for low risk patient       Relevant Orders   Hepatitis C antibody (reflex, frozen specimen)       Outpatient Encounter Medications as of 02/02/2021  Medication Sig   acetaminophen (TYLENOL) 500 MG tablet Take 1 tablet (500 mg total) by mouth every 6 (six) hours as needed for mild pain, fever or headache.   cefdinir (OMNICEF) 300 MG capsule Take 1 capsule (300 mg total) by mouth 2 (two) times daily.   doxycycline (VIBRA-TABS) 100 MG tablet Take 1 tablet (100 mg total) by mouth 2 (two) times daily.   ibuprofen (ADVIL) 600 MG tablet Take 1 tablet (600 mg total) by mouth every 6 (six) hours as needed for mild pain or moderate pain.   oxymetazoline (AFRIN) 0.05 % nasal spray Place 1 spray into left nostril 2 (two) times daily as needed for congestion.   No facility-administered encounter medications on file as of 02/02/2021.    Follow-up: Return in about 1 year (around 02/02/2022) for Annual check-up.   Vittorio Mohs, DO

## 2021-02-02 NOTE — Assessment & Plan Note (Signed)
Patient was noted to have hypertriglyceridemia during his hospitalization in the ICU.  Patient was not fasting today, lab visit created for patient to come back for a fasting lipid panel.  Patient given handout on dietary and lifestyle modifications for decreasing triglycerides.

## 2021-02-02 NOTE — Patient Instructions (Addendum)
It is a great meeting you today.  We are going to check your cholesterol levels today since you have had elevated triglycerides in the past.  We are also going to screen for HIV and hepatitis C today, this is just a screening tools that are recommended for any adult at least once in their life just as a random screening.  If there are any abnormal results we will call you, if everything is normal we may send you a MyChart message or a letter.  If you have any questions or concerns do not hesitate to reach out to our office at [phone].    High Triglycerides Eating Plan Triglycerides are a type of fat in the blood. High levels of triglycerides can increase your risk of heart disease and stroke. If your triglyceride levels are high, choosing the right foods can help lower your triglycerides and keep your heart healthy. Work with your health care provider or a diet and nutrition specialist (dietitian) to develop an eating plan that is right for you. What are tips for following this plan? General guidelines  Lose weight, if you are overweight. For most people, losing 5-10 lbs (2-5 kg) helps lower triglyceride levels. A weight-loss plan may include. 30 minutes of exercise at least 5 days a week. Reducing the amount of calories, sugar, and fat you eat. Eat a wide variety of fresh fruits, vegetables, and whole grains. These foods are high in fiber. Eat foods that contain healthy fats, such as fatty fish, nuts, seeds, and olive oil. Avoid foods that are high in added sugar, added salt (sodium), saturated fat, and trans fat. Avoid low-fiber, refined carbohydrates such as white bread, crackers, noodles, and white rice. Avoid foods with partially hydrogenated oils (trans fats), such as fried foods or stick margarine. Limit alcohol intake to no more than 1 drink a day for nonpregnant women and 2 drinks a day for men. One drink equals 12 oz of beer, 5 oz of wine, or 1 oz of hard liquor. Your health care  provider may recommend that you drink less depending on your overall health.  Reading food labels Check food labels for the amount of saturated fat. Choose foods with no or very little saturated fat. Check food labels for the amount of trans fat. Choose foods with no trans fat. Check food labels for the amount of cholesterol. Choose foods low in cholesterol. Ask your dietitian how much cholesterol you should have each day. Check food labels for the amount of sodium. Choose foods with less than 140 milligrams (mg) per serving. Shopping Buy dairy products labeled as nonfat (skim) or low-fat (1%). Avoid buying processed or prepackaged foods. These are often high in added sugar, sodium, and fat. Cooking Choose healthy fats when cooking, such as olive oil or canola oil. Cook foods using lower fat methods, such as baking, broiling, boiling, or grilling. Make your own sauces, dressings, and marinades when possible, instead of buying them. Store-bought sauces, dressings, and marinades are often high in sodium and sugar. Meal planning Eat more home-cooked food and less restaurant, buffet, and fast food. Eat fatty fish at least 2 times each week. Examples of fatty fish include salmon, trout, mackerel, tuna, and herring. If you eat whole eggs, do not eat more than 3 egg yolks per week. What foods are recommended? The items listed may not be a complete list. Talk with your dietitian aboutwhat dietary choices are best for you. Grains Whole wheat or whole grain breads, crackers, cereals, and  pasta. Unsweetenedoatmeal. Bulgur. Barley. Quinoa. Brown rice. Whole wheat flour tortillas. Vegetables Fresh or frozen vegetables. Low-sodium canned vegetables. Fruits All fresh, canned (in natural juice), or frozen fruits. Meats and other protein foods Skinless chicken or [place]. Ground chicken or [place]. Lean cuts of pork, trimmed of fat. Fish and seafood, especially salmon, trout, and herring. Egg whites. Dried  beans, peas, or lentils. Unsalted nuts or seeds. Unsalted cannedbeans. Natural peanut or almond butter. Dairy Low-fat dairy products. Skim or low-fat (1%) milk. Reduced fat (2%) and low-sodium cheese. Low-fat ricotta cheese. Low-fat cottage cheese. Plain,low-fat yogurt. Fats and oils Tub margarine without trans fats. Light or reduced-fat mayonnaise. Light or reduced-fat salad dressings.Avocado. Safflower, olive, sunflower, soybean, and canola oils. What foods are not recommended? The items listed may not be a complete list. Talk with your dietitian aboutwhat dietary choices are best for you. Grains White bread. White (regular) pasta. White rice. Cornbread. Bagels. Pastries. Crackers that contain trans fat. Vegetables Creamed or fried vegetables. Vegetables in a cheese sauce. Fruits Sweetened dried fruit. Canned fruit in syrup. Fruit juice. Meats and other protein foods Fatty cuts of meat. Ribs. Chicken wings. Tomasa Blase. Sausage. Bologna. Salami.Chitterlings. Fatback. Hot dogs. Bratwurst. Packaged lunch meats. Dairy Whole or reduced-fat (2%) milk. Half-and-half. Cream cheese. Full-fat or sweetened yogurt. Full-fat cheese. Nondairy creamers. Whipped toppings.Processed cheese or cheese spreads. Cheese curds. Beverages Alcohol. Sweetened drinks, such as soda, lemonade, fruit drinks, or punches. Fats and oils Butter. Stick margarine. Lard. Shortening. Ghee. Bacon fat. Tropical oils, suchas coconut, palm kernel, or palm oils. Sweets and desserts Corn syrup. Sugars. Honey. Molasses. Candy. Jam and jelly. Syrup. Sweetenedcereals. Cookies. Pies. Cakes. Donuts. Muffins. Ice cream. Condiments Store-bought sauces, dressings, and marinades that are high in sugar, such asketchup and barbecue sauce. Summary High levels of triglycerides can increase the risk of heart disease and stroke. Choosing the right foods can help lower your triglycerides. Eat plenty of fresh fruits, vegetables, and whole grains. Choose  low-fat dairy and lean meats. Eat fatty fish at least twice a week. Avoid processed and prepackaged foods with added sugar, sodium, saturated fat, and trans fat. If you need suggestions or have questions about what types of food are good for you, talk with your health care provider or a dietitian. This information is not intended to replace advice given to you by your health care provider. Make sure you discuss any questions you have with your healthcare provider. Document Revised: 10/11/2019 Document Reviewed: 10/13/2019 Elsevier Patient Education  2022 [employer].

## 2021-02-05 ENCOUNTER — Other Ambulatory Visit: Payer: Self-pay

## 2021-02-05 ENCOUNTER — Other Ambulatory Visit: Payer: BC Managed Care – PPO

## 2021-02-05 DIAGNOSIS — Z114 Encounter for screening for human immunodeficiency virus [HIV]: Secondary | ICD-10-CM | POA: Diagnosis not present

## 2021-02-05 DIAGNOSIS — E782 Mixed hyperlipidemia: Secondary | ICD-10-CM

## 2021-02-05 DIAGNOSIS — Z1159 Encounter for screening for other viral diseases: Secondary | ICD-10-CM | POA: Diagnosis not present

## 2021-02-06 LAB — HCV INTERPRETATION

## 2021-02-06 LAB — LIPID PANEL
Chol/HDL Ratio: 6.1 ratio — ABNORMAL HIGH (ref 0.0–5.0)
Cholesterol, Total: 226 mg/dL — ABNORMAL HIGH (ref 100–199)
HDL: 37 mg/dL — ABNORMAL LOW (ref >39)
LDL Chol Calc (NIH): 124 mg/dL — ABNORMAL HIGH (ref 0–99)
Triglycerides: 368 mg/dL — ABNORMAL HIGH (ref 0–149)
VLDL Cholesterol Cal: 65 mg/dL — ABNORMAL HIGH (ref 5–40)

## 2021-02-06 LAB — HIV ANTIBODY (ROUTINE TESTING W REFLEX): HIV Screen 4th Generation wRfx: NONREACTIVE

## 2021-02-06 LAB — HCV AB W REFLEX TO QUANT PCR: HCV Ab: <0.1 s/co ratio (ref 0.0–0.9)

## 2021-03-26 ENCOUNTER — Encounter: Payer: Self-pay | Admitting: Family Medicine

## 2021-03-26 ENCOUNTER — Ambulatory Visit (INDEPENDENT_AMBULATORY_CARE_PROVIDER_SITE_OTHER): Payer: BC Managed Care – PPO | Admitting: Family Medicine

## 2021-03-26 ENCOUNTER — Other Ambulatory Visit: Payer: Self-pay

## 2021-03-26 VITALS — BP 133/99 | HR 83 | Ht 69.0 in | Wt 225.6 lb

## 2021-03-26 DIAGNOSIS — I10 Essential (primary) hypertension: Secondary | ICD-10-CM | POA: Diagnosis not present

## 2021-03-26 DIAGNOSIS — E782 Mixed hyperlipidemia: Secondary | ICD-10-CM

## 2021-03-26 DIAGNOSIS — Z0000 Encounter for general adult medical examination without abnormal findings: Secondary | ICD-10-CM | POA: Diagnosis not present

## 2021-03-26 NOTE — Assessment & Plan Note (Signed)
BPs have been elevated intermittently in the past, BP today is 133/29.  Patient does have other blood pressure readings with systolics in the low 100s.  Do not feel at this time that medication is warranted given patient is willing to do dietary changes.  Discussed DASH diet and speaking with a nutritionist.

## 2021-03-26 NOTE — Patient Instructions (Addendum)

## 2021-03-26 NOTE — Progress Notes (Signed)
Subjective:  CC -- Annual Physical   Patient reports an occasional concern regarding difficulty with swallowing his saliva.  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  He does not have any issue with liquids or solids otherwise, if she does not bother him at night does not affect his breathing.  Cardiovascular: - Dx Hypertension: Yes, not on medications - Dx Hyperlipidemia: Elevated cholesterol with hypertriglyceridemia - Dx Obesity: Yes, BMI 33.32 kg/m - Physical Activity: yes, currently working to increase physical activity as he has not been cleared by most of his arm surgeons - Diabetes: No, A1c on 6/29 was 5.3  Cancer: Lung >> Tobacco Use: no. Does smoke marijuana. Skin >> Suspicious lesions: no   Social: Alcohol Use: yes, occasionally socially <3 drinks each time  Tobacco Use: no  Other Drugs: yes, marijuana  Risky Sexual Behavior: no  Depression: no   - PHQ9 score: 0 Support and Life at Home: yes   Other: Flu Vaccine: no, likely will get it this year  ROS-  Past Medical History Patient Active Problem List   Diagnosis Date Noted   Essential hypertension 03/26/2021   Mixed hypercholesterolemia and hypertriglyceridemia 02/02/2021   Crushing injury of left forearm 09/22/2020    Medications- reviewed and updated Current Outpatient Medications  Medication Sig Dispense Refill   acetaminophen (TYLENOL) 500 MG tablet Take 1 tablet (500 mg total) by mouth every 6 (six) hours as needed for mild pain, fever or headache. 30 tablet 0   cefdinir (OMNICEF) 300 MG capsule Take 1 capsule (300 mg total) by mouth 2 (two) times daily. 11 capsule 0   doxycycline (VIBRA-TABS) 100 MG tablet Take 1 tablet (100 mg total) by mouth 2 (two) times daily. 11 tablet 0   ibuprofen (ADVIL) 600 MG tablet Take 1 tablet (600 mg total) by mouth every 6 (six) hours as needed for mild pain or  moderate pain. 30 tablet 0   oxymetazoline (AFRIN) 0.05 % nasal spray Place 1 spray into left nostril 2 (two) times daily as needed for congestion. 30 mL 0   No current facility-administered medications for this visit.    Objective: BP (!) 133/99   Pulse 83   Ht 5\' 9"  (1.753 m)   Wt 225 lb 9.6 oz (102.3 kg)   SpO2 99%   BMI 33.32 kg/m  Gen: NAD, alert, cooperative with exam HEENT: NCAT, EOMI, PERRL, throat without erythema or swelling CV: RRR, good S1/S2, no murmur Resp: CTABL, no wheezes, non-labored Abd: Soft, Non Tender, Non Distended, BS present, no guarding or organomegaly Genital Exam: not done Ext: No edema, warm. Scars noted on right arm Neuro: Alert and oriented, No gross deficits   Assessment/Plan:  Essential hypertension BPs have been elevated intermittently in the past, BP today is 133/29.  Patient does have other blood pressure readings with systolics in the low 100s.  Do not feel at this time that medication is warranted given patient is willing to do dietary changes.  Discussed DASH diet and speaking with a nutritionist.  Mixed hypercholesterolemia and hypertriglyceridemia Total cholesterol 222, LDL 124, triglycerides 368.  Patient may have benefit for statin medication in the future, we will discuss this in the future as his LDL was elevated above 100.  Do feel the patient will benefit from dietary changes and may also consider rechecking labs at 30-month mark    8-month, DO, PGY-2 03/26/2021 4:51 PM

## 2021-03-26 NOTE — Assessment & Plan Note (Signed)
Total cholesterol 222, LDL 124, triglycerides 368.  Patient may have benefit for statin medication in the future, we will discuss this in the future as his LDL was elevated above 100.  Do feel the patient will benefit from dietary changes and may also consider rechecking labs at 62-month mark

## 2021-11-27 ENCOUNTER — Encounter: Payer: Self-pay | Admitting: *Deleted

## 2022-09-04 IMAGING — CT CT NECK W/ CM
3 of 4 series · 11 of 33 positions shown, 13 images · IV contrast (Omnipaque or Isovue)
Comparison: None.
COMPARISON: None.

Addendum:
CLINICAL DATA: 40-year-old male with sore throat, difficulty
swallowing.

EXAM:
CT NECK WITH CONTRAST
TECHNIQUE: Multidetector CT imaging of the neck was performed using the
standard protocol following the bolus administration of intravenous
contrast.
CONTRAST:  75mL OMNIPAQUE IOHEXOL 300 MG/ML  SOLN

[Series 4: cor neck · coronal · 0.44mm/px · 3 of 157 slices shown]
[im 32/157  bone]
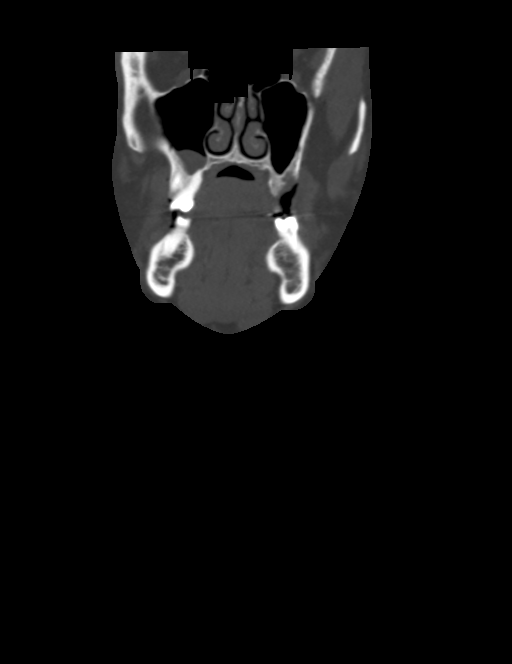
[im 63/157  bone]
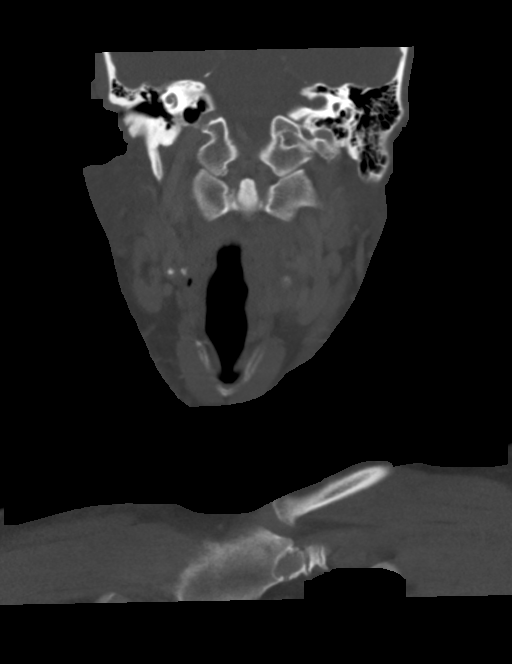
[im 94/157  bone]
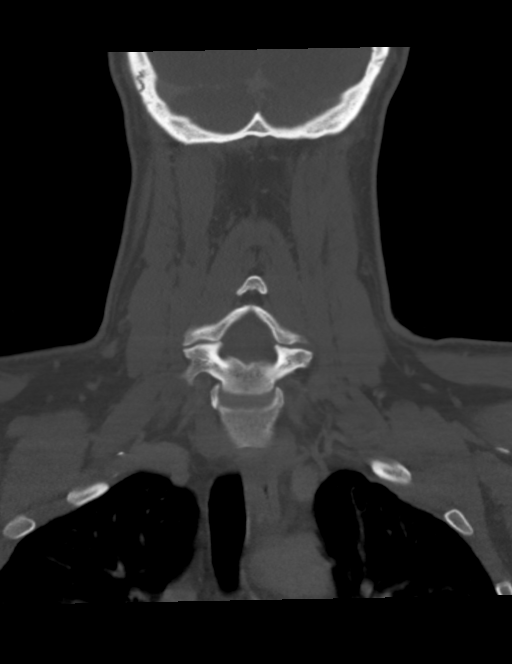

[Series 5: sag neck · sagittal · 0.39mm/px · 5 of 117 slices shown, 6 images]
[im 39/117  bone]
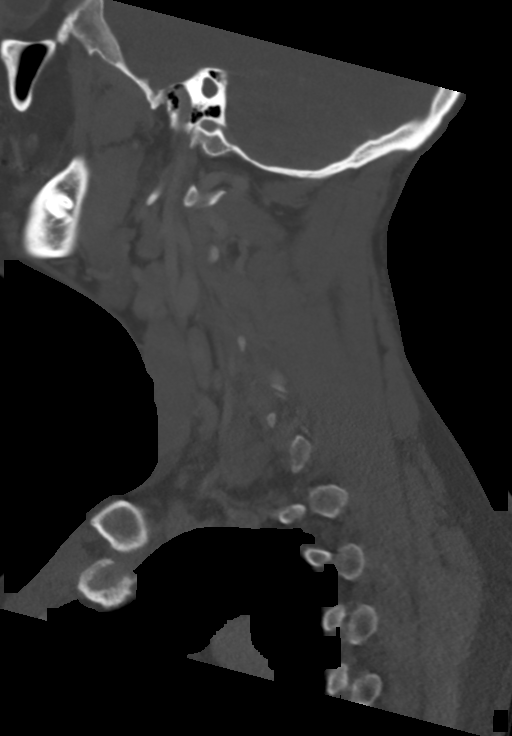
[im 49/117  bone]
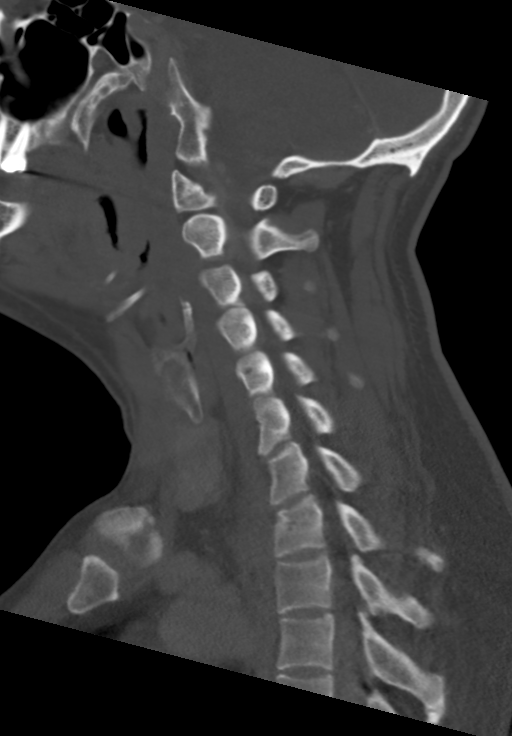
[im 59/117  soft-tissue]
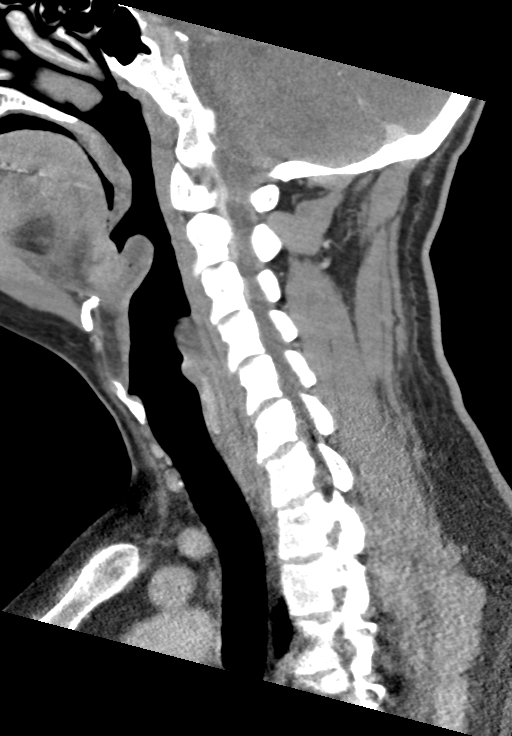
[im 59/117  bone]
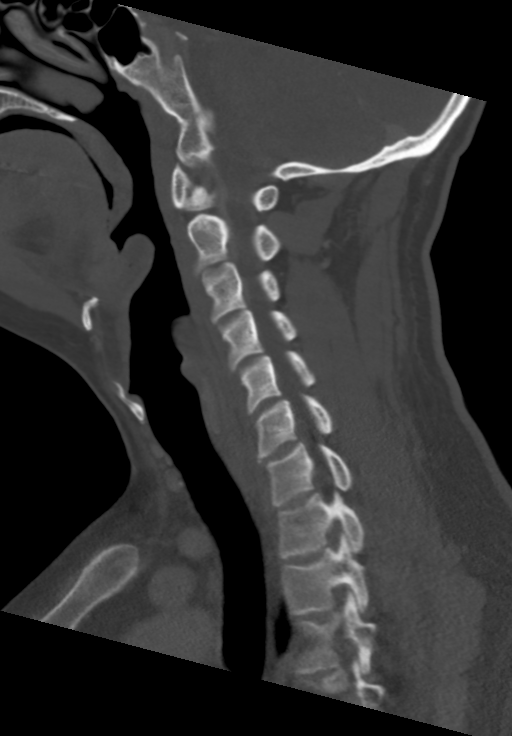
[im 68/117  bone]
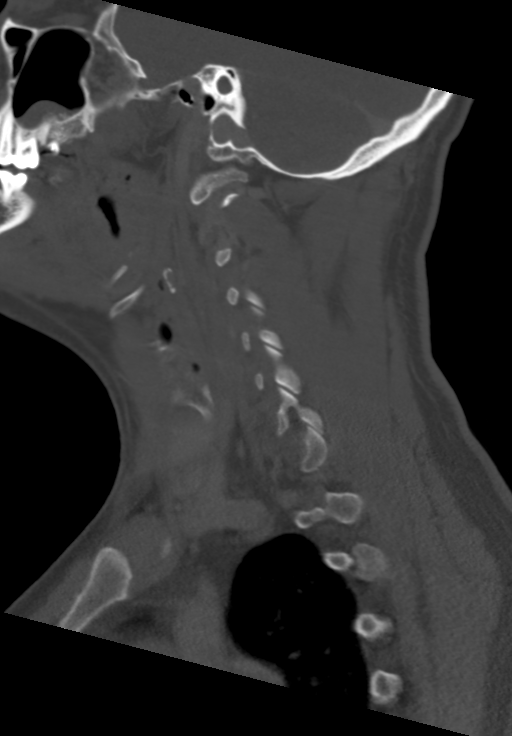
[im 78/117  bone]
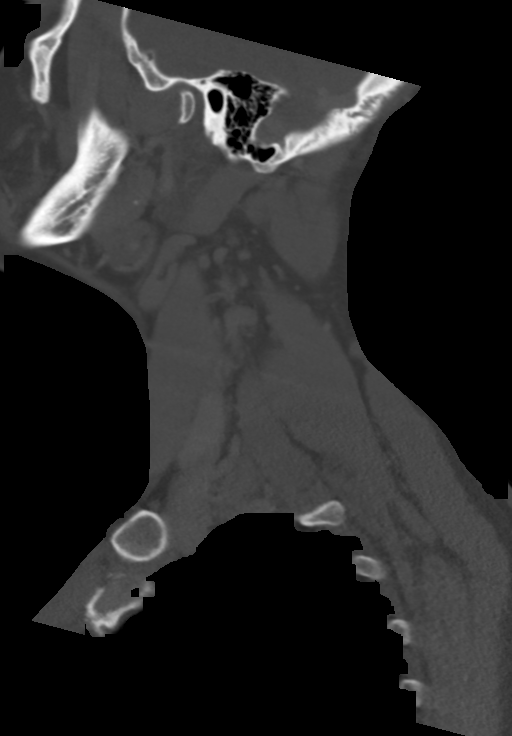

[Series 6: ax oropharynx · axial · 0.47mm/px · z∈[+1524,+1685]mm · 3 of 145 slices shown, 4 images]
[im 42/145  soft-tissue]
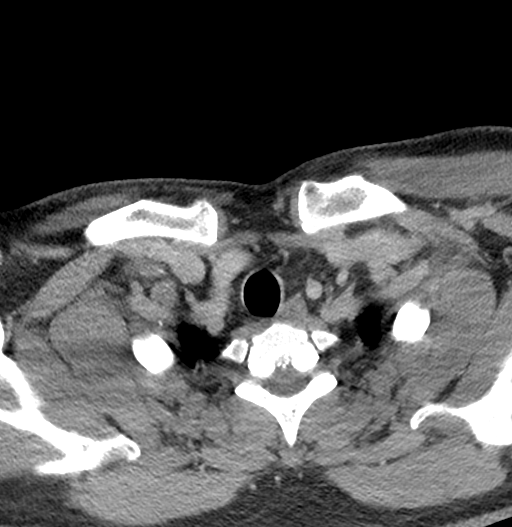
[im 42/145  bone]
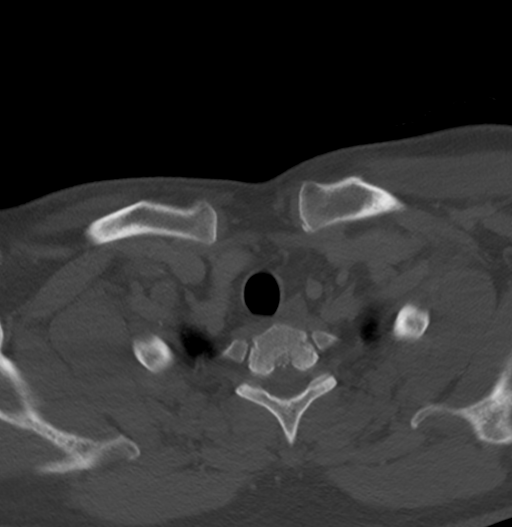
[im 83/145  bone]
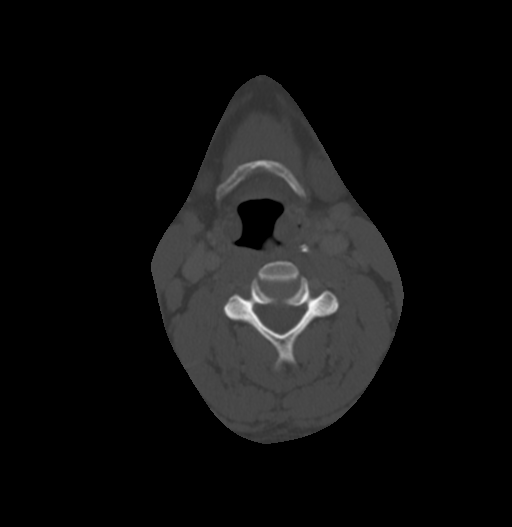
[im 124/145  bone]
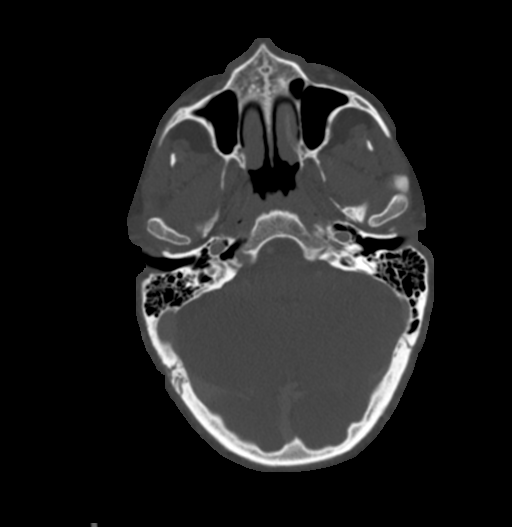

[11 of 33 positions shown; findings below may reference images not displayed]

FINDINGS: Pharynx and larynx: The glottis and subglottic larynx are normal.

There is moderate to severe mucosal space edema involving the
epiglottis, bilateral aryepiglottic folds, and supraglottic larynx.
There is associated abnormal submucosal or trapped gas at the
epiglottis and hypopharynx.

But only subtle parapharyngeal space inflammation is identified on
the left. The retropharyngeal space remains normal. The oropharynx
and nasopharynx remain normal.

Salivary glands: Negative sublingual space. Submandibular and
parotid glands are symmetric and within normal limits.

Thyroid: Negative.

Lymph nodes: Reactive appearing right side level 2 and level 3 lymph
nodes, mostly 8-9 mm short axis. A 14 mm short axis right level IIIb
node is noted on sagittal image 38. No cystic or necrotic nodes.
Other nodal stations appear more normal.

Vascular: Major vascular structures in the neck and at the skull
base are patent including the bilateral internal jugular veins, the
right is dominant. Dominant left vertebral artery also noted (normal
variant).

Limited intracranial: Negative.

Visualized orbits: Negative.

Mastoids and visualized paranasal sinuses: Mild right maxillary
alveolar recess mucosal thickening. Otherwise well aerated.

Skeleton: Carious right maxillary posterior dentition. Other
sequelae of prior posterior dental extraction are noted. No acute
osseous abnormality identified.

Upper chest: Normal subglottic trachea and visible carina. Upper
lungs are clear. Negative visible superior mediastinum.
IMPRESSION: 1. Positive for Acute Epiglottitis: Severe mucosal thickening and
edema diffusely involving the epiglottis and supraglottic larynx.
Some emphysematous changes to the epiglottis are noted. But there is
only trace parapharyngeal space inflammation.
2. Mild, reactive mostly right side cervical lymphadenopathy.
3. Carious posterior right maxillary dentition.

ADDENDUM:
Critical Value/emergent results were called by telephone at the time
of interpretation on 12/20/2020 at 7575 hours to Dr. DEPLEASURE O[facility] ,
who verbally acknowledged these results.

We discussed that the main differential consideration is a
non-infectious Angioedema.

*** End of Addendum ***
FINDINGS: Pharynx and larynx: The glottis and subglottic larynx are normal.

There is moderate to severe mucosal space edema involving the
epiglottis, bilateral aryepiglottic folds, and supraglottic larynx.
There is associated abnormal submucosal or trapped gas at the
epiglottis and hypopharynx.

But only subtle parapharyngeal space inflammation is identified on
the left. The retropharyngeal space remains normal. The oropharynx
and nasopharynx remain normal.

Salivary glands: Negative sublingual space. Submandibular and
parotid glands are symmetric and within normal limits.

Thyroid: Negative.

Lymph nodes: Reactive appearing right side level 2 and level 3 lymph
nodes, mostly 8-9 mm short axis. A 14 mm short axis right level IIIb
node is noted on sagittal image 38. No cystic or necrotic nodes.
Other nodal stations appear more normal.

Vascular: Major vascular structures in the neck and at the skull
base are patent including the bilateral internal jugular veins, the
right is dominant. Dominant left vertebral artery also noted (normal
variant).

Limited intracranial: Negative.

Visualized orbits: Negative.

Mastoids and visualized paranasal sinuses: Mild right maxillary
alveolar recess mucosal thickening. Otherwise well aerated.

Skeleton: Carious right maxillary posterior dentition. Other
sequelae of prior posterior dental extraction are noted. No acute
osseous abnormality identified.

Upper chest: Normal subglottic trachea and visible carina. Upper
lungs are clear. Negative visible superior mediastinum.
IMPRESSION: 1. Positive for Acute Epiglottitis: Severe mucosal thickening and
edema diffusely involving the epiglottis and supraglottic larynx.
Some emphysematous changes to the epiglottis are noted. But there is
only trace parapharyngeal space inflammation.
2. Mild, reactive mostly right side cervical lymphadenopathy.
3. Carious posterior right maxillary dentition.

## 2022-09-06 IMAGING — DX DG ABD PORTABLE 1V
1 series · 1 of 1 positions shown · non-contrast
Comparison: None.

CLINICAL DATA: Check gastric catheter placement

EXAM:
PORTABLE ABDOMEN - 1 VIEW

[abdomen kub]
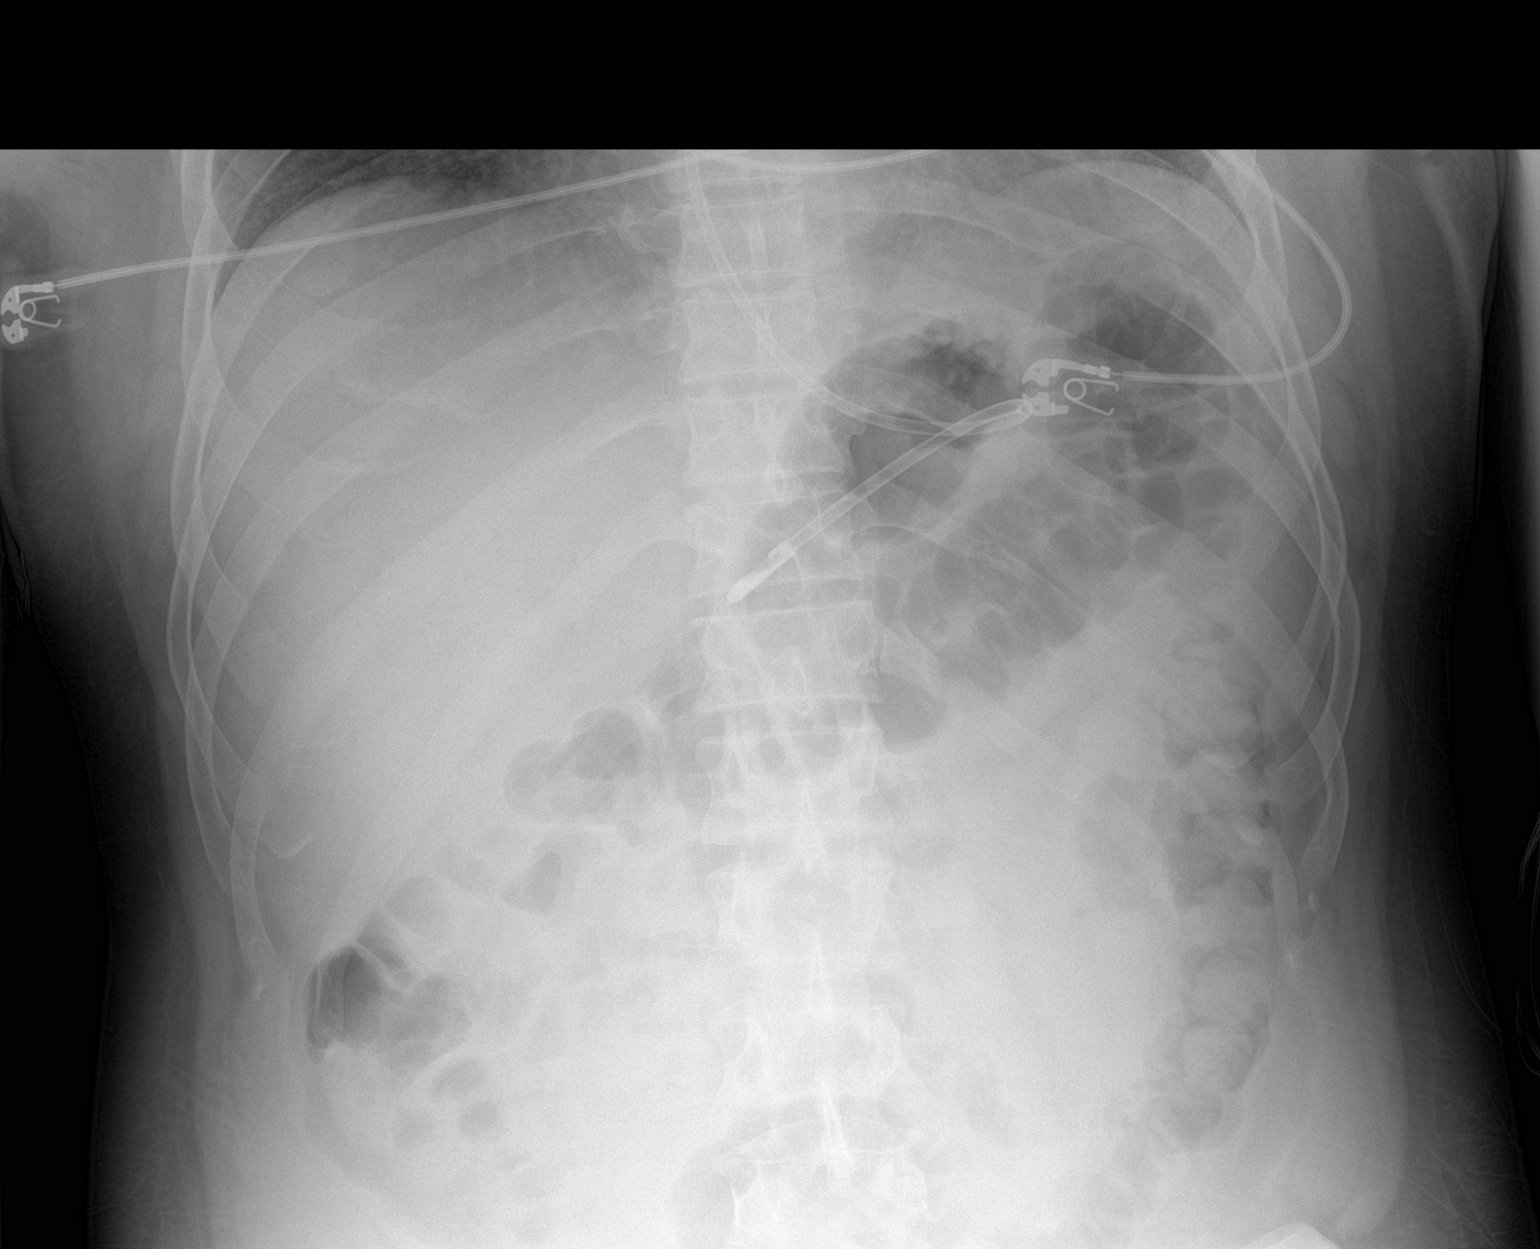

[1 of 1 positions shown; findings below may reference images not displayed]

FINDINGS: Catheter is noted directed towards the distal aspect of the stomach.
Scattered large and small bowel gas is noted. No free air is seen.
IMPRESSION: Feeding catheter in the distal stomach.
